# Patient Record
Sex: Male | Born: 1935 | Race: White | Hispanic: No | Smoking: Current every day smoker
Health system: Southern US, Community
[De-identification: ages and names within clinical notes are randomized; demographics above are authoritative.]

## PROBLEM LIST (undated history)

## (undated) DIAGNOSIS — I2510 Atherosclerotic heart disease of native coronary artery without angina pectoris: Secondary | ICD-10-CM

## (undated) DIAGNOSIS — I459 Conduction disorder, unspecified: Secondary | ICD-10-CM

## (undated) DIAGNOSIS — Z9841 Cataract extraction status, right eye: Secondary | ICD-10-CM

## (undated) DIAGNOSIS — H538 Other visual disturbances: Secondary | ICD-10-CM

## (undated) DIAGNOSIS — E785 Hyperlipidemia, unspecified: Secondary | ICD-10-CM

## (undated) DIAGNOSIS — I639 Cerebral infarction, unspecified: Secondary | ICD-10-CM

## (undated) DIAGNOSIS — N400 Enlarged prostate without lower urinary tract symptoms: Secondary | ICD-10-CM

## (undated) DIAGNOSIS — G309 Alzheimer's disease, unspecified: Secondary | ICD-10-CM

## (undated) DIAGNOSIS — I10 Essential (primary) hypertension: Secondary | ICD-10-CM

## (undated) DIAGNOSIS — H269 Unspecified cataract: Secondary | ICD-10-CM

## (undated) DIAGNOSIS — H409 Unspecified glaucoma: Secondary | ICD-10-CM

## (undated) DIAGNOSIS — F0280 Dementia in other diseases classified elsewhere without behavioral disturbance: Secondary | ICD-10-CM

## (undated) HISTORY — DX: Cataract extraction status, right eye: Z98.41

## (undated) HISTORY — DX: Unspecified glaucoma: H40.9

## (undated) HISTORY — DX: Other visual disturbances: H53.8

## (undated) HISTORY — DX: Conduction disorder, unspecified: I45.9

## (undated) HISTORY — DX: Benign prostatic hyperplasia without lower urinary tract symptoms: N40.0

## (undated) HISTORY — DX: Hyperlipidemia, unspecified: E78.5

## (undated) HISTORY — DX: Atherosclerotic heart disease of native coronary artery without angina pectoris: I25.10

## (undated) HISTORY — DX: Essential (primary) hypertension: I10

## (undated) HISTORY — DX: Alzheimer's disease, unspecified: G30.9

## (undated) HISTORY — DX: Dementia in other diseases classified elsewhere, unspecified severity, without behavioral disturbance, psychotic disturbance, mood disturbance, and anxiety: F02.80

## (undated) HISTORY — DX: Unspecified cataract: H26.9

## (undated) HISTORY — DX: Cerebral infarction, unspecified: I63.9

---

## 2004-04-13 ENCOUNTER — Ambulatory Visit: Payer: Self-pay | Admitting: Cardiology

## 2004-04-13 ENCOUNTER — Inpatient Hospital Stay (HOSPITAL_COMMUNITY): Admission: EM | Admit: 2004-04-13 | Discharge: 2004-04-14 | Payer: Self-pay | Admitting: Cardiology

## 2004-04-13 HISTORY — PX: INSERT / REPLACE / REMOVE PACEMAKER: SUR710

## 2004-04-26 ENCOUNTER — Ambulatory Visit: Payer: Self-pay

## 2004-04-26 ENCOUNTER — Ambulatory Visit: Payer: Self-pay | Admitting: Internal Medicine

## 2004-08-07 ENCOUNTER — Ambulatory Visit: Payer: Self-pay | Admitting: Cardiology

## 2004-08-07 ENCOUNTER — Ambulatory Visit: Payer: Self-pay | Admitting: Internal Medicine

## 2005-05-24 ENCOUNTER — Ambulatory Visit: Payer: Self-pay | Admitting: Internal Medicine

## 2005-05-29 ENCOUNTER — Ambulatory Visit: Payer: Self-pay | Admitting: Cardiology

## 2005-07-25 ENCOUNTER — Ambulatory Visit: Payer: Self-pay | Admitting: Cardiology

## 2005-07-27 ENCOUNTER — Inpatient Hospital Stay (HOSPITAL_COMMUNITY): Admission: AD | Admit: 2005-07-27 | Discharge: 2005-08-06 | Payer: Self-pay | Admitting: Vascular Surgery

## 2005-07-27 ENCOUNTER — Ambulatory Visit: Payer: Self-pay | Admitting: Cardiology

## 2005-07-28 ENCOUNTER — Encounter (INDEPENDENT_AMBULATORY_CARE_PROVIDER_SITE_OTHER): Payer: Self-pay | Admitting: *Deleted

## 2005-07-31 ENCOUNTER — Ambulatory Visit: Payer: Self-pay | Admitting: Pulmonary Disease

## 2005-08-01 ENCOUNTER — Ambulatory Visit: Payer: Self-pay | Admitting: Physical Medicine & Rehabilitation

## 2006-10-10 ENCOUNTER — Encounter: Admission: RE | Admit: 2006-10-10 | Discharge: 2006-10-10 | Payer: Self-pay | Admitting: Vascular Surgery

## 2006-10-10 ENCOUNTER — Ambulatory Visit: Payer: Self-pay | Admitting: Vascular Surgery

## 2007-03-05 ENCOUNTER — Ambulatory Visit: Payer: Self-pay | Admitting: Cardiology

## 2007-06-18 ENCOUNTER — Ambulatory Visit (HOSPITAL_COMMUNITY): Admission: RE | Admit: 2007-06-18 | Discharge: 2007-06-18 | Payer: Self-pay | Admitting: Ophthalmology

## 2007-07-10 ENCOUNTER — Ambulatory Visit: Payer: Self-pay

## 2007-09-03 ENCOUNTER — Ambulatory Visit (HOSPITAL_COMMUNITY): Admission: RE | Admit: 2007-09-03 | Discharge: 2007-09-03 | Payer: Self-pay | Admitting: Ophthalmology

## 2007-09-13 ENCOUNTER — Emergency Department (HOSPITAL_COMMUNITY): Admission: EM | Admit: 2007-09-13 | Discharge: 2007-09-13 | Payer: Self-pay | Admitting: Emergency Medicine

## 2008-09-29 ENCOUNTER — Encounter (INDEPENDENT_AMBULATORY_CARE_PROVIDER_SITE_OTHER): Payer: Self-pay | Admitting: *Deleted

## 2008-11-11 ENCOUNTER — Ambulatory Visit: Payer: Self-pay | Admitting: Internal Medicine

## 2010-03-01 ENCOUNTER — Ambulatory Visit (HOSPITAL_COMMUNITY): Admission: RE | Admit: 2010-03-01 | Discharge: 2010-03-01 | Payer: Self-pay | Admitting: Ophthalmology

## 2010-03-09 ENCOUNTER — Encounter (INDEPENDENT_AMBULATORY_CARE_PROVIDER_SITE_OTHER): Payer: Self-pay | Admitting: *Deleted

## 2010-07-10 NOTE — Letter (Signed)
Summary: Device-Delinquent Check  [facility], Main Office  [address]   [place], [place] [address]   Phone: [phone]  Fax: [phone]     March 09, 2010 MRN: [id number]   Bruce French [address] [place], [place]  [address]   Dear Bruce French,  According to our records, you have not had your implanted device checked in the recommended period of time.  We are unable to determine appropriate device function without checking your device on a regular basis.  Please call our office to schedule an appointment with Dr Graciela Husbands,  as soon as possible.  If you are having your device checked by another physician, please call us so that we may update our records.  Thank you, Letta Moynahan, EMT  March 09, 2010 9:57 AM   [facility]

## 2010-08-23 LAB — BASIC METABOLIC PANEL
BUN: 23 mg/dL (ref 6–23)
CO2: 31 mEq/L (ref 19–32)
Calcium: 9.4 mg/dL (ref 8.4–10.5)
Chloride: 99 mEq/L (ref 96–112)
Creatinine, Ser: 1.37 mg/dL (ref 0.4–1.5)
GFR calc Af Amer: >60 mL/min (ref >60)
GFR calc non Af Amer: 50 mL/min — ABNORMAL LOW (ref >60)
Glucose, Bld: 121 mg/dL — ABNORMAL HIGH (ref 70–99)
Potassium: 3.7 mEq/L (ref 3.5–5.1)
Sodium: 137 mEq/L (ref 135–145)

## 2010-08-23 LAB — HEMOGLOBIN AND HEMATOCRIT, BLOOD
HCT: 44.3 % (ref 39.0–52.0)
Hemoglobin: 14.6 g/dL (ref 13.0–17.0)

## 2010-10-23 NOTE — Op Note (Signed)
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   OPERATION PERFORMED:  Anterior chamber washout and lens aspiration and  posterior synechialysis, left eye.   SURGEON:  Bonne Dolores. Alto Denver, M.D.   ANESTHESIA:  Topical with monitored anesthesia care.   OPERATIVE SUMMARY:  In the preoperative area, 2% viscous lidocaine was  placed into the left eye.  The patient was then brought to the operating  room where the eye was prepped and draped.  After placement of a  Lieberman lid speculum, a super-sharp blade was used to make a  paracentesis port at the surgeon's 2 o'clock position.  The anterior  chamber was then filled with 1% nonpreserved lidocaine solution followed  by Amvisc Plus.  The cannula with Amvisc Plus was used to lyse the  posterior synechia which were between the iris and the intraocular lens.  A 2.4 mm keratome blade was then used to make a clear corneal incision  at the temporal limbus.  The irrigation and aspiration handpiece was  then used to aspirate remnants of lens material on the anterior chamber  with careful attention to the chamber angles after complete washout.  The irrigation and aspiration handpiece was then removed.  Stromal  hydration of the paracentesis port and the main incision was performed.  The wound was tested for leak which was negative.  The patient tolerated  the procedure well.  There was no operative complication and he was  returned to  the recovery area in satisfactory condition.  There were no  specimens obtained and there were no prosthetic devices used.           ______________________________  Susanne Greenhouse, MD     KEH/MEDQ  D:  09/03/2007  T:  09/04/2007  Job:  [id number]

## 2010-10-23 NOTE — Cardiovascular Report (Signed)
[facility]                   [facility]ICE [facility]IC NOTE   OBBIE, LEWALLEN                      MRN:          [id number]  DATE:03/05/2007                            DOB:          28-Feb-1926    Mr. Pezzullo was seen today in the [facility] for followup of his  Medtronic Kappa pacemaker, model #901.  His device was implanted on  April 13, 2004, for a complete heart block.   Interrogation of his device demonstrates,  P waves of 4 to 5.6 millivolts with an atrial impedance of 526 ohms and  a threshold of 1.04 milliseconds.  His R waves measured 15.68 to 22.4 millivolts with an RV impedance of  610 ohms and a threshold of 0.5 volts at 0.4 milliseconds.  His battery voltage was 2.77 volts.  He is not pacemaker dependent.  He was in complete heart block today with an escape in the 40s.  He did have 11,683 mode switch episodes which represented 0.6% of his  total time.  These look like far felt R wave over sensing.  I could not  reproduce this today on interrogation, so I did not change any  sensitivities.  He did have 7 ventricular heart rate episodes the longest of which was 3  seconds.  He is programmed DDDR with a low rate of 60 and upper rate of 120 with a  paced AV of 150 milliseconds and a sensed AV of 120 milliseconds.  Stretching this out, I could not display intrinsic conduction today  because of his complete heart block, so this was not changed.  His atrial output was decreased to 2 volts.  He is currently A pacing 43.5% of the time and V pacing 99.9% of the  time.   Today, Mr. Arredondo was enrolled in [facility] and will return to the clinic  in 1 year.      Gypsy Balsam, RN,BSN  Electronically Signed      Duke Salvia, MD, [facility]  Electronically Signed   AS/MedQ  DD: 03/05/2007  DT: 03/05/2007  Job #: [id number]

## 2010-10-26 NOTE — Discharge Summary (Signed)
NAME:  Bruce French, Bruce French NO.:  [id number]   MEDICAL RECORD NO.:  [id number]          PATIENT TYPE:  INP   LOCATION:  2004                         FACILITY:  [facility]   PHYSICIAN:  Janetta Hora. Fields, MD  DATE OF BIRTH:  [birth year]   DATE OF ADMISSION:  07/27/2005  DATE OF DISCHARGE:                                 DISCHARGE SUMMARY   PRIMARY DIAGNOSIS:  Infrarenal abdominal aortic aneurysm.   SECONDARY DIAGNOSIS:  1.  Benign prostatic hypertrophy.  2.  Chronic obstructive pulmonary disease.  3.  Hyperlipidemia.  4.  Heart block status post pacemaker placement.  5.  Chronic cough.  6.  History of glaucoma.  7.  Hypertension.   ALLERGIES:  No known drug allergies.   IN-HOSPITAL OPERATION AND PROCEDURES:  Repair and graft of infrarenal  abdominal aortic aneurysm with Dacron aorto-bi-iliac graft, emergent.   HOSPITAL COURSE:  The patient is a 77 year old male transferred from  [facility] with a 6 cm abdominal aortic aneurysm.  The patient was  admitted to [facility] on February 14 with back pain and weakness in the left  leg.  A workup showed a 6 cm aneurysm with no evidence of rupture.  The  patient was transferred over to [facility] for evaluation and  possible surgery.  CT angiogram was done July 28, 2005, which revealed a  left psoas hematoma.  CT scan from [place] was unable to be obtained.  Dr.  Darrick Penna discussed with Dr. Drue Second the results of the CT scan which showed no  psoas hematomas on July 26, 2005.  It is felt that the patient's scan is  positive for ruptured aneurysm.  Following review of this, Dr. Darrick Penna  discussed with the patient taking him emergently to the operating room for  repair.  He discussed the risks and benefits of this procedure.  The patient  acknowledged understanding and agreed to proceed.   The patient was taken emergently to the operating room on July 28, 2005.  The patient underwent repair and grafting  of infrarenal abdominal aortic  aneurysm with a 14 by 8 Dacron aorta-bi-iliac bypass grafted.  It was noted  that during surgery, no rupture was seen.  The patient tolerated the  procedure well and was transferred up to the intensive care unit.  The  patient was extubated the morning of surgery.  Following surgery,  pulmonary/critical care was consulted to help manage the patient's  respiratory issues and started the patient's bronchodilators.  They  continued to follow the patient during his hospital stay.  They started him  on nebulizer.  Recommended the patient be discharged home with this.  Patient stable to be weaned off oxygen saturating greater than 90% prior to  discharge home.  The patient's postoperative course was pretty much  unremarkable.  Following extubation, he was seen to be alert and oriented x  3, neurologically intact.  The patient was out of bed and ambulating postop  day two.  The incisions were clean, dry, and intact.  He was transferred out  to 2000 postop day two.  The patient remained  n.p.o. and started on clear  liquids postoperative day three.  The patient tolerated the procedure well  with no nausea and vomiting and advanced to a regular diet on postop day  four.  Again, the patient tolerated this well with no nausea and vomiting.  The patient was positive for bowel movement on postop day four.  The patient  was seen to be hemodynamically stable following surgery.  Vital signs  remained stable.  During the patient's hospital course, Dr. Nash Shearer was  consulted to evaluate his left lower extremity weakness.  There is a concern  with the findings on CT at L4-L5.  It is felt that the patient will  eventually need EMG and nerve conduction studies if the left lower extremity  does not improve following surgery.  Dr. Nash Shearer did continue to follow the  patient postoperatively.  He will plan to follow up the patient in the next  2-4 weeks.   The patient tentatively  ready for discharge home postop day five, August 03, 2005.  Follow up appointment has been arranged with Dr. Darrick Penna for August 23, 2005, at 9:45 a.m.  Staple removal is arranged for August 16, 2005, at 9  a.m.  The patient is to contact Dr. Fredda Hammed office to schedule a follow up  appointment in 2-4 weeks.  Bruce French received instructions on diet,  activity level, and incisional care.  He was told no driving until released  to do so, no heavy lifting over 10 pounds.  The patient is told he is  allowed to shower washing his incisions using soap and water.  He is to  contact the office if he develops any drainage or opening from any of his  incision sites.  He was told to ambulate 3-4 times per day, to progress as  tolerated, and to continue doing his breathing exercises.  Home health  physical therapy and occupational therapy and home health nurse has been  arranged prior to discharge home.  The patient is educated on diet to be low  fat, low salt.   DISCHARGE MEDICATIONS:  1.  Percocet 5/325, 1-2 tabs q.4-6h. p.r.n. pain.  2.  Lipitor 10 mg daily.  3.  Paxil 10 mg daily.  4.  Flomax 0.4 mg daily.  5.  Aspirin 325 mg daily.  6.  Proscar 5 mg daily.  7.  Protonix 40 mg daily.  8.  Diovan 80 mg daily.  9.  Xanax 0.25 mg t.i.d. p.r.n.  10. Plavix 75 mg daily.  11. Spiriva 18 mcg daily.      Theda Belfast, PA    ______________________________  Janetta Hora Fields, MD    KMD/MEDQ  D:  08/02/2005  T:  08/02/2005  Job:  [id number]   cc:   Bevelyn Buckles. Nash Shearer, M.D.  Fax: [phone]

## 2010-10-26 NOTE — Discharge Summary (Signed)
NAME:  Bruce French, DELUCIA NO.:  [id number]   MEDICAL RECORD NO.:  [id number]          PATIENT TYPE:  INP   LOCATION:  3707                         FACILITY:  [facility]   PHYSICIAN:  Rollene Rotunda, M.D.   DATE OF BIRTH:  01-15-1926   DATE OF ADMISSION:  04/13/2004  DATE OF DISCHARGE:  04/14/2004                           DISCHARGE SUMMARY - REFERRING   PROCEDURE:  Medtronic pacemaker implantation April 14, 2004.   REASON FOR ADMISSION:  Please refer to Dr. Fayrene Fearing Hochrein's consultation  note of April 13, 2004 for full details.   LABORATORY DATA:  None.   HOSPITAL COURSE:  Following transfer from [facility], where  the patient presented with symptomatic complete heart block.  The patient  underwent same-day successful implantation of a Medtronic Dual-Chamber  Pacemaker, by Dr. Rollene Rotunda.  There were no noted complications.   The patient was kept overnight for overnight observation, pacemaker was  interrogated the following morning with normal function and parameters, and  patient was cleared for discharge by Dr. Sherryl Manges.  Dr. Graciela Husbands did,  however, recommend proceeding with outpatient adenosine Cardiolite stress  testing at time of Pacemaker Clinic followup in two weeks [facility]).   The patient was discharged on previous home medication regimen.   DISCHARGE MEDICATIONS:  1.  Lipitor 10 mg daily.  2.  Coated aspirin 325 mg daily.  3.  Flomax 0.4 mg daily.  4.  Diovan/HCT 160/4.5 mg daily.  5.  Protonix 40 mg daily.  6.  Plavix 75 mg daily.  7.  Paxil 10 mg daily.   INSTRUCTIONS:  The patient is to raise is affected arm to the shoulder, as  outlined per instruction sheet.   The patient will have a followup adenosine Cardiolite in approximately two  weeks, on the same day as a scheduled Pacemaker Clinic followup, in  [place].  Arrangements will be made through our office.   DISCHARGE DIAGNOSES:  1.  Symptomatic complete heart  block.      1.  Status post Medtronic Dual-Chamber Pacemaker implantation April 13, 2004.  2.  Multiple cardiac risk factors.      1.  Dyslipidemia.      2.  Hypertension.      3.  Tobacco.      4.  Age.  3.  Status post stroke 1990.  4.  Cerebrovascular disease.       GS/MEDQ  D:  04/14/2004  T:  04/14/2004  Job:  [id number]   cc:   [facility]  [address]  [address]  [place]  [place] [address]   Marcelino Duster  [address] [place]  [place] [address]  Fax: [phone]

## 2010-10-26 NOTE — Op Note (Signed)
NAME:  OSRIC, KLOPF NO.:  [id number]   MEDICAL RECORD NO.:  [id number]          PATIENT TYPE:  INP   LOCATION:  2854                         FACILITY:  [facility]   PHYSICIAN:  Rollene Rotunda, M.D.   DATE OF BIRTH:  1925-10-21   DATE OF PROCEDURE:  04/13/2004  DATE OF DISCHARGE:                                 OPERATIVE REPORT   PROCEDURE PERFORMED:  Implantation of dual chamber pacemaker.   INDICATIONS FOR PROCEDURE:  Complete heart block with severe bradycardia and  near syncope.  After informed consent was obtained, the patient was taken to  cardiac catheterization lab, prepped for pacemaker placement.  After usual  preparation and draping, intravenous midazolam was given for mild sedation.  30 mL of lidocaine was infiltrated in the left infraclavicular region.  A 5  cm incision was created over this region and electrocautery with blunt  dissection used to dissect down to the fascial plane.  Hemostasis was  achieved.  A pocket was formed subcutaneously and copiously irrigated.  The  left subclavian vein was then punctured x2.  A Medtronic model number 5076  52 cm atrial lead and 5076 58 cm ventricular lead were inserted over a  guidewire.  We used active fixation leads. The serial number for the atrial  lead was [id number], the ventricular lead [id number].  The subclavian vein  was entered extrathoracically without complications.  Threshold in the  atrium was 0.5, pulse width was 0.5, impedance 744, P-wave 8.9, no  diaphragmatic stimulation at 10 V.  The V readings were a threshold of 0.5  V, pulse width 0.5 V, impedance 822 ohms.  R-wave 24 __________ mV, current  point 7 mA.  There again was no diaphragmatic stimulation at 10 V.  With the  atrial leads and ventricular leads secured in the pectoralis fascia with a  sewing sleeve and silk suture, electrocautery was utilized to achieve  hemostasis and again, the pocket was irrigated with kanamycin solution.  A  Medtronic dual chamber pacemaker Kappa [id number], serial number [id number] was  secured to the prepectoral fascia.  The pocket was then closed using 2-0  Vicryl in a vertical mattress followed by a horizontal mattress followed by  a layer of 4-0 Vicryl.  Benzoin was painted on the skin.  Steri-Strips were  applied.  The patient tolerated the entire procedure without any  complications.       JH/MEDQ  D:  04/13/2004  T:  04/15/2004  Job:  [id number]   cc:   Marcelino Duster  [address] [place]  [place] [address]  Fax: [phone]

## 2010-10-26 NOTE — Consult Note (Signed)
NAME:  Bruce French, Bruce French NO.:  [id number]   MEDICAL RECORD NO.:  [id number]          PATIENT TYPE:  INP   LOCATION:  5703                         FACILITY:  [facility]   PHYSICIAN:  Bevelyn Buckles. Champey, M.D.DATE OF BIRTH:  28-Dec-1925   DATE OF CONSULTATION:  07/28/2005  DATE OF DISCHARGE:                                   CONSULTATION   REASON FOR CONSULTATION:  Left lower extremity weakness.   HISTORY OF PRESENT ILLNESS:  Mr. Bruce French is a 77 year old Caucasian male with  multiple medical problems who recently was transferred from [facility] after evaluation for back pain and was found to have an abdominal  aortic aneurysm on CT scan.  Patient has had complaint of low back pain,  some lower extremity weakness and pain for the past week.  He states that  his pain is intermittent and mostly around the knee and left hip.  It is  described as throbbing, nontender in intensity and Percocet usually relieves  it.  Patient has felt weak in the  lower extremity and felt unsteady on his  feet at times.  He denies any numbness or tingling, upper extremity  symptoms, neck pain, dizziness, vertigo, speech changes, vision changes,  swallowing problems, tooth problems or loss of consciousness.   PAST MEDICAL HISTORY:  1.  Positive for heart block, status post pacemaker.  2.  Elevated lipids.  3.  Hypertension.  4.  COPD.  5.  Stroke.  6.  BPH.  7.  Glaucoma.   CURRENT MEDICATIONS:  1.  Plavix.  2.  Paxil.  3.  Protonix.  4.  Hydrochlorothiazide.  5.  Xanax.  6.  Solu-Medrol.  7.  Diovan.   ALLERGIES:  NO KNOWN DRUG ALLERGIES.   FAMILY HISTORY:  Noncontributory.   SOCIAL HISTORY:  Patient does smoke.   REVIEW OF SYSTEMS:  Positive as HPI and also positive for dyspnea.  Review  of systems negative as per HPI and greater than 7 other organ systems.   PHYSICAL EXAMINATION:  VITAL SIGNS:  Temperature 96.0, pulse 60,  respirations 16, blood pressure 144/76, O2  saturation 96% on two liters.  HEENT:  Normocephalic and atraumatic.  Extraocular movements intact.  NECK:  Supple.  No carotid bruits.  LUNGS:  Clear.  CARDIOVASCULAR:  Regular.  ABDOMEN:  Soft and nontender.  EXTREMITIES:  No edema.  Good pulses.  NEUROLOGIC:  Patient is awake, alert and oriented x3.  Language is fluent.  Memory and knowledge are within normal limits.  Cranial nerves II-XII  grossly intact.  Motor examination:  Patient has 5/5 strength in bilateral  upper extremities and right lower extremity.  Patient has 2 to 3/5 strength  in proximal left lower extremity primarily in hip flexion and hip abduction.  Otherwise patient has 4+ to 5-/5 strength in left lower extremity.  Patient  has normal tone noted.  No drift is found in the upper extremities.  Sensory  examination is within normal limits to light tough.  Reflexes are 2 to 3+  throughout and fairly symmetric.  Toes are withdrawn bilaterally.  Cerebellar function  is within normal limits finger-to-nose.  Gait is  slightly wide based yet steady.   Patient did have a CT of the lumbar spine at [facility] and this, by  report only, films are not reviewed.  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  Patient also had degenerative disc disease and  degenerative spondylosis.  Patient had grade I posterior  pseudospondylolisthesis of L4 on L5 with no spinal stenosis.   LABORATORY DATA:  WBC 13.9, hemoglobin 11.8, hematocrit 35, platelets 296.  Sodium 136, potassium 4, chloride 99, CO2 29, BUN 25, creatinine 1.2,  glucose 101.  AST 59, ALT 22, total protein 5.2, albumin 2.6, calcium 8,  alkaline phosphatase 66, total bilirubin 1.2.   IMPRESSION:  Mr. Bruce French is a 77 year old Caucasian male with abdominal  aortic aneurysm, back pain and left lower extremity weakness.  CT report was  reviewed but no films are available.  I would like to view these images, so  I will repeat a CT of the lumbar spine for  further evaluation.  Continue the  patient's current pain medications.  His aneurysm might be contributing to  his pain and symptoms, however, there is concern with findings on the CT at  L4-L5, although his strength distally in the lower extremities seems fairly  good.  Patient seems to have weakness primarily in hip flexion and hip  abduction.  I think patient will eventually need EMG and nerve conduction  studies if patient does not improve after surgery.  This can be done as an  outpatient.  We will consider again neurosurgery evaluation after CT of the  lumbar spine is done and evaluated.  Patient's gait is fairly steady,  however, I would still consult PT and OT for evaluation, especially after  surgery.  We will follow the patient while he is in the hospital.      Bevelyn Buckles. Nash Shearer, M.D.  Electronically Signed     DRC/MEDQ  D:  07/28/2005  T:  07/29/2005  Job:  [id number]

## 2010-10-26 NOTE — Op Note (Signed)
NAME:  Bruce French, VILORIA NO.:  [id number]   MEDICAL RECORD NO.:  [id number]          PATIENT TYPE:  INP   LOCATION:  2004                         FACILITY:  [facility]   PHYSICIAN:  Janetta Hora. Fields, MD  DATE OF BIRTH:  1925-07-10   DATE OF PROCEDURE:  07/28/2005  DATE OF DISCHARGE:                                 OPERATIVE REPORT   PROCEDURE:  Aorto bi-iliac grafting, for repair of abdominal aortic  aneurysm.   PREOPERATIVE DIAGNOSIS:  Symptomatic infrarenal abdominal aortic aneurysm.   POSTOPERATIVE DIAGNOSIS:  1.  Symptomatic infrarenal abdominal aortic aneurysm.  2.  Appropriate reduction to symptomatic possible rupture.   ANESTHESIA:  General.   ASSISTANT:  Rowe Clack, P.A.-C.   INDICATIONS:  The patient is a 77 year old male transferred from [facility] with a complaint of back and left leg pain. He had a CT  scan at [facility], which showed a 5.9 cm infrarenal abdominal aortic  aneurysm. He was transferred for further management of this. He had a repeat  CT scan this afternoon, which showed a left psoas hematoma.  He continues to  have some back pain and was taken to the operating room emergently for  possible ruptured aneurysm. Of note, the psoas hematoma was not present on  the CT scan at [facility] 48 hours ago, according to the radiologist  at [facility].   OPERATIVE FINDINGS:  1.  Non-ruptured 6 cm infrarenal abdominal aortic aneurysm.  2.  14 x 7 Dacron graft.   OPERATIVE DETAILS:  After obtaining informed consent, the patient was taken  to the operating room. The patient was placed in the supine position on the  operating room table. The patient was hemodynamically stable during this  time. After induction of general anesthesia and endotracheal intubation, a  nasogastric tube and Foley catheter were placed. Next, the patient was  prepped from the nipples to the knees. A midline laparotomy incision was  made, extending from  the xiphoid down to the level of the pubis. Incision  was carried down through subcutaneous tissues. The fascia and peritoneum  were opened for the full length of the incision. An Omni retractor was then  brought up into the operative field. The colon and omentum were reflected  superiorly. The small bowel was reflected to the right, and Omni retractor  was used to hold these in position. Retroperitoneum was inspected and found  to be without any staining or hematoma. The remainder of the abdomen  limited; exploratory laparotomy showed essentially normal viscera.   Next, attention was turned the retroperitoneum. This was opened with cautery  dissection. Dissection was carried up over the level of the aneurysm, to the  level of the left renal vein. Left and right renal arteries were identified  and protected. A suitable site for clamping was dissected free just below  the renal arteries. Dissection was then carried down to the level of the  aortic bifurcation. The left and right common iliac arteries were fairly  heavily calcified, but there was a suitable section that was softer -- just  above the  iliac bifurcation. These were dissected free circumferentially and  controlled with umbilical tapes.   Next, the patient was given 5000 units of intravenous heparin. The aneurysm  was opened.  Several lumbar arteries were oversewn with 2-0 silk sutures.  There was one sacral artery bleeding from the aortic bifurcation; this was  also oversewn. After hemostasis was then obtained, the neck was dissected  free. A 14 x 7 mm Dacron graft was brought up into the operative field and  fashioned to fit the proximal portion. This was then sewn in with a running  3-0 Prolene suture. At completion of the anastomosis, the clamp was removed  and peripheral DeBakey clamp was placed on each iliac limb. The anastomosis  was tested and found be hemostatic. One additional repair stitch was placed  anteriorly.    Next, attention was turned to the iliac limbs. The the aortic bifurcation  was not suitable for placing the tube graft. Therefore, suitable segment of  the left and right common iliac arteries were prepared for grafting. There  was a soft segment anteriorly on both these arteries, although there was  fairly heavy calcification.. Of note, the inferior mesenteric artery had  good back bleeding and this was ligated. Next, the right limb of the graft  was pulled down to the right common iliac artery. This was spatulated on the  artery side and on the graft side. This was brought down to the level of the  mid common iliac artery and sewn end-to-end using a running 5-0 Prolene  suture. Just prior to completion of the anastomosis, it was fore bled, back  bled and thoroughly flushed. Anastomosis was secured and the clamp was  released from the right leg. There was a brief transient pressure drop, and  this recovered quickly.   Attention was then turned to the left side. In similar fashion, at mid iliac  artery, the artery was transected and the graft cut to length.  The graft  was then sewn end-to-end to the left common iliac artery in similar fashion,  using running 5-0 Prolene suture. Just prior to completion of the  anastomosis, it was fore bled, back bled and thoroughly flushed. Clamps were  released. Again, there was a brief pressure drop which quickly recovered.   Next, all anastomoses were inspected and found be hemostatic. The left colon  was inspected and found to be pink and well-perfused. Retroperitoneum and  the aneurysm sac were then closed with a running 3-0 Prolene suture. The  viscera returned to their normal position. The fascia was reapproximated  using a #1 PDS suture. Skin was closed with staples. The patient's feet were  inspected at the end of the case; found to be pink and warm.  The patient tolerated procedure well and there were no complications.  Instrument, sponge and  needle counts were correct at the end of the case.  The patient was taken to the intensive care unit intubated and in stable  condition.           ______________________________  Janetta Hora Fields, MD     CEF/MEDQ  D:  08/06/2005  T:  08/06/2005  Job:  [id number]

## 2011-02-28 LAB — BASIC METABOLIC PANEL
BUN: 14
CO2: 28
Calcium: 9.6
Chloride: 95 — ABNORMAL LOW
Creatinine, Ser: 1.2
GFR calc Af Amer: >60
GFR calc non Af Amer: 58 — ABNORMAL LOW
Glucose, Bld: 75
Potassium: 3.8
Sodium: 134 — ABNORMAL LOW

## 2011-02-28 LAB — HEMOGLOBIN AND HEMATOCRIT, BLOOD
HCT: 45.9
Hemoglobin: 15.4

## 2011-03-04 LAB — BASIC METABOLIC PANEL
BUN: 19
CO2: 27
Calcium: 9.1
Chloride: 103
Creatinine, Ser: 1.23
GFR calc Af Amer: >60
GFR calc non Af Amer: 57 — ABNORMAL LOW
Glucose, Bld: 99
Potassium: 3.9
Sodium: 136

## 2011-03-04 LAB — HEMOGLOBIN AND HEMATOCRIT, BLOOD
HCT: 43.8
Hemoglobin: 15.4

## 2011-12-06 ENCOUNTER — Encounter: Payer: Self-pay | Admitting: *Deleted

## 2012-03-31 DIAGNOSIS — L918 Other hypertrophic disorders of the skin: Secondary | ICD-10-CM | POA: Diagnosis not present

## 2012-03-31 DIAGNOSIS — H02839 Dermatochalasis of unspecified eye, unspecified eyelid: Secondary | ICD-10-CM | POA: Diagnosis not present

## 2012-03-31 DIAGNOSIS — H04229 Epiphora due to insufficient drainage, unspecified lacrimal gland: Secondary | ICD-10-CM | POA: Diagnosis not present

## 2012-03-31 DIAGNOSIS — H02539 Eyelid retraction unspecified eye, unspecified lid: Secondary | ICD-10-CM | POA: Diagnosis not present

## 2012-03-31 DIAGNOSIS — L908 Other atrophic disorders of skin: Secondary | ICD-10-CM | POA: Diagnosis not present

## 2012-05-21 ENCOUNTER — Other Ambulatory Visit (HOSPITAL_COMMUNITY): Payer: Self-pay | Admitting: Family Medicine

## 2012-05-21 DIAGNOSIS — R51 Headache: Secondary | ICD-10-CM

## 2012-05-22 ENCOUNTER — Ambulatory Visit (HOSPITAL_COMMUNITY): Payer: PRIVATE HEALTH INSURANCE

## 2012-05-26 ENCOUNTER — Ambulatory Visit (HOSPITAL_COMMUNITY): Payer: PRIVATE HEALTH INSURANCE

## 2012-12-01 DIAGNOSIS — N189 Chronic kidney disease, unspecified: Secondary | ICD-10-CM | POA: Diagnosis not present

## 2012-12-01 DIAGNOSIS — I10 Essential (primary) hypertension: Secondary | ICD-10-CM | POA: Diagnosis not present

## 2012-12-01 DIAGNOSIS — F0390 Unspecified dementia without behavioral disturbance: Secondary | ICD-10-CM | POA: Diagnosis not present

## 2012-12-01 DIAGNOSIS — E119 Type 2 diabetes mellitus without complications: Secondary | ICD-10-CM | POA: Diagnosis not present

## 2012-12-01 DIAGNOSIS — M1990 Unspecified osteoarthritis, unspecified site: Secondary | ICD-10-CM | POA: Diagnosis not present

## 2012-12-01 DIAGNOSIS — F329 Major depressive disorder, single episode, unspecified: Secondary | ICD-10-CM | POA: Diagnosis not present

## 2012-12-01 DIAGNOSIS — F17200 Nicotine dependence, unspecified, uncomplicated: Secondary | ICD-10-CM | POA: Diagnosis not present

## 2012-12-01 DIAGNOSIS — E782 Mixed hyperlipidemia: Secondary | ICD-10-CM | POA: Diagnosis not present

## 2012-12-15 DIAGNOSIS — I442 Atrioventricular block, complete: Secondary | ICD-10-CM

## 2013-01-04 ENCOUNTER — Encounter: Payer: Self-pay | Admitting: *Deleted

## 2013-01-04 DIAGNOSIS — J439 Emphysema, unspecified: Secondary | ICD-10-CM | POA: Insufficient documentation

## 2013-01-04 DIAGNOSIS — E785 Hyperlipidemia, unspecified: Secondary | ICD-10-CM | POA: Insufficient documentation

## 2013-01-04 DIAGNOSIS — I10 Essential (primary) hypertension: Secondary | ICD-10-CM | POA: Insufficient documentation

## 2013-01-04 DIAGNOSIS — N189 Chronic kidney disease, unspecified: Secondary | ICD-10-CM | POA: Insufficient documentation

## 2013-01-04 DIAGNOSIS — F32A Depression, unspecified: Secondary | ICD-10-CM | POA: Insufficient documentation

## 2013-01-04 DIAGNOSIS — F0390 Unspecified dementia without behavioral disturbance: Secondary | ICD-10-CM | POA: Insufficient documentation

## 2013-01-04 DIAGNOSIS — E119 Type 2 diabetes mellitus without complications: Secondary | ICD-10-CM

## 2013-01-04 DIAGNOSIS — F329 Major depressive disorder, single episode, unspecified: Secondary | ICD-10-CM

## 2013-01-04 DIAGNOSIS — L859 Epidermal thickening, unspecified: Secondary | ICD-10-CM | POA: Insufficient documentation

## 2013-01-04 DIAGNOSIS — J449 Chronic obstructive pulmonary disease, unspecified: Secondary | ICD-10-CM | POA: Insufficient documentation

## 2013-01-04 DIAGNOSIS — I639 Cerebral infarction, unspecified: Secondary | ICD-10-CM | POA: Insufficient documentation

## 2013-01-05 ENCOUNTER — Encounter: Payer: Self-pay | Admitting: Cardiovascular Disease

## 2013-01-05 ENCOUNTER — Encounter: Payer: Self-pay | Admitting: *Deleted

## 2013-01-05 ENCOUNTER — Ambulatory Visit (INDEPENDENT_AMBULATORY_CARE_PROVIDER_SITE_OTHER): Payer: PRIVATE HEALTH INSURANCE | Admitting: *Deleted

## 2013-01-05 ENCOUNTER — Ambulatory Visit (HOSPITAL_COMMUNITY)
Admission: RE | Admit: 2013-01-05 | Discharge: 2013-01-05 | Disposition: A | Discharge status: Home / Self Care | Payer: PRIVATE HEALTH INSURANCE | Source: Ambulatory Visit | Attending: Cardiovascular Disease | Admitting: Cardiovascular Disease

## 2013-01-05 ENCOUNTER — Ambulatory Visit (INDEPENDENT_AMBULATORY_CARE_PROVIDER_SITE_OTHER): Payer: PRIVATE HEALTH INSURANCE | Admitting: Cardiovascular Disease

## 2013-01-05 VITALS — BP 140/70 | HR 65 | Ht 70.0 in | Wt 162.0 lb

## 2013-01-05 DIAGNOSIS — I6522 Occlusion and stenosis of left carotid artery: Secondary | ICD-10-CM

## 2013-01-05 DIAGNOSIS — I442 Atrioventricular block, complete: Secondary | ICD-10-CM

## 2013-01-05 DIAGNOSIS — R0602 Shortness of breath: Secondary | ICD-10-CM | POA: Insufficient documentation

## 2013-01-05 DIAGNOSIS — Z01818 Encounter for other preprocedural examination: Secondary | ICD-10-CM

## 2013-01-05 DIAGNOSIS — J4489 Other specified chronic obstructive pulmonary disease: Secondary | ICD-10-CM | POA: Insufficient documentation

## 2013-01-05 DIAGNOSIS — R42 Dizziness and giddiness: Secondary | ICD-10-CM | POA: Insufficient documentation

## 2013-01-05 DIAGNOSIS — Z136 Encounter for screening for cardiovascular disorders: Secondary | ICD-10-CM

## 2013-01-05 DIAGNOSIS — Z45018 Encounter for adjustment and management of other part of cardiac pacemaker: Secondary | ICD-10-CM

## 2013-01-05 DIAGNOSIS — Z45010 Encounter for checking and testing of cardiac pacemaker pulse generator [battery]: Secondary | ICD-10-CM

## 2013-01-05 DIAGNOSIS — I6529 Occlusion and stenosis of unspecified carotid artery: Secondary | ICD-10-CM

## 2013-01-05 DIAGNOSIS — J984 Other disorders of lung: Secondary | ICD-10-CM | POA: Insufficient documentation

## 2013-01-05 DIAGNOSIS — Z950 Presence of cardiac pacemaker: Secondary | ICD-10-CM | POA: Insufficient documentation

## 2013-01-05 DIAGNOSIS — J449 Chronic obstructive pulmonary disease, unspecified: Secondary | ICD-10-CM | POA: Insufficient documentation

## 2013-01-05 LAB — PACEMAKER DEVICE OBSERVATION
BATTERY VOLTAGE: 2.61 V
BMOD-0003RV: 30
BMOD-0005RV: 95 {beats}/min
BRDY-0002RV: 65 {beats}/min
DEVICE MODEL PM: 412680
RV LEAD IMPEDENCE PM: 515 Ohm
VENTRICULAR PACING PM: 98

## 2013-01-05 LAB — BASIC METABOLIC PANEL
BUN: 20 mg/dL (ref 6–23)
CO2: 29 mEq/L (ref 19–32)
Calcium: 9.8 mg/dL (ref 8.4–10.5)
Chloride: 101 mEq/L (ref 96–112)
Creat: 1.37 mg/dL — ABNORMAL HIGH (ref 0.50–1.35)
Glucose, Bld: 98 mg/dL (ref 70–99)
Potassium: 3.9 mEq/L (ref 3.5–5.3)
Sodium: 139 mEq/L (ref 135–145)

## 2013-01-05 LAB — CBC
HCT: 46.4 % (ref 39.0–52.0)
Hemoglobin: 15.4 g/dL (ref 13.0–17.0)
MCH: 29.8 pg (ref 26.0–34.0)
MCHC: 33.2 g/dL (ref 30.0–36.0)
MCV: 89.9 fL (ref 78.0–100.0)
Platelets: 239 10*3/uL (ref 150–400)
RDW: 13.6 % (ref 11.5–15.5)
WBC: 9.8 10*3/uL (ref 4.0–10.5)

## 2013-01-05 LAB — PROTIME-INR
INR: 1.02 (ref <1.50)
Prothrombin Time: 13.2 seconds (ref 11.6–15.2)

## 2013-01-05 LAB — APTT: aPTT: 30 seconds (ref 24–37)

## 2013-01-05 NOTE — Progress Notes (Signed)
Patient ID: JULIAS MOULD, male   DOB: 30-Jul-1925, 79 y.o.   MRN: [id number]    CARDIOLOGY CONSULT NOTE  Patient ID: MARTHA SOLTYS MRN: [id number] DOB/AGE: May 04, 1926 79 y.o.  Admit date: (Not on file) Primary Physician: Juliette Alcide, MD  Reason for Consultation: dizziness, arrhythmia  HPI:  Mr. Pavlov is an 79 y.o. Male with a PMH significant for CVA, HTN, hyperlipidemia, GERD, and type II diabetes mellitus, who presents with dizziness. He also has a pacemaker which was implanted on April 13, 2004, for complete heart block. His daughter says his pacemaker hasn't been interrogated in a long time.  He's been having bad headaches and dizziness for the past 3-4 months.  He denies chest pain, shortness of breath, and palpitations.  He says when he stands up, he feels "swimmy-headed" and it takes him a little while to gain his balance.  He reportedly had a head CT and carotid Dopplers "not too long ago at [facility]".   Review of systems complete and found to be negative unless listed above in HPI  Past Medical History: see HPI   No family history on file.  History   Social History  . Marital Status: Widowed    Spouse Name: N/A    Number of Children: N/A  . Years of Education: N/A   Occupational History  . Not on file.   Social History Main Topics  . Smoking status: Current Every Day Smoker -- 1.00 packs/day for 80 years    Types: Cigarettes  . Smokeless tobacco: Not on file  . Alcohol Use: Not on file  . Drug Use: Not on file  . Sexually Active: Not on file   Other Topics Concern  . Not on file   Social History Narrative  . No narrative on file      Physical exam Blood pressure 140/70, pulse 65, height 5\' 10"  (1.778 m), weight 162 lb (73.483 kg). General: NAD Neck: No JVD, no thyromegaly or thyroid nodule.  Lungs: Clear to auscultation bilaterally with normal respiratory effort. CV: Nondisplaced PMI.  Heart regular S1/S2, no S3/S4, no  murmur.  No peripheral edema.  No carotid bruit.  Normal pedal pulses.  Abdomen: Soft, nontender, no hepatosplenomegaly, no distention.  Skin: Intact without lesions or rashes.  Neurologic: Alert and oriented x 3.  Psych: Normal affect. Extremities: No clubbing or cyanosis.  HEENT: Normal.   Labs:   Lab Results  Component Value Date   HGB 14.6 02/22/2010   HCT 44.3 02/22/2010   No results found for this basename: NA, K, CL, CO2, BUN, CREATININE, CALCIUM, LABALBU, PROT, BILITOT, ALKPHOS, ALT, AST, GLUCOSE,  in the last 168 hours No results found for this basename: CKTOTAL, CKMB, CKMBINDEX, TROPONINI    No results found for this basename: CHOL   No results found for this basename: HDL   No results found for this basename: LDLCALC   No results found for this basename: TRIG   No results found for this basename: CHOLHDL   No results found for this basename: LDLDIRECT       EKG: V-paced, 65 bpm  Pacemaker interrogation: VVI pacing, ERI December 07, 2012  Echo (12/15/2012): Normal LV systolic function, EF 55-60%, grade I diastolic dysfunction, mild LAE, MAC, mild AI, mild TR  Carotid Dopplers (12/15/2012): 50-69% LICA, less than 50% RICA  Head CT: atrophic changes, no acute infarct  ASSESSMENT AND PLAN:  1. Dizziness: It appears the pacemaker's ERI was December 07, 2012, and he  is now pacing only in VVI mode for battery preservation. This may very likely be contributing to the patient's symptoms. We will arrange for him to have the battery replaced. In the meantime, I will obtain the results of his head CT and carotid Dopplers, albeit carotid stenosis would not be contributing to dizziness. He does have a h/o CVA and he may have posterior circulatory problems causing his dizziness as well.  2. Carotid stenosis: will survey with annual Dopplers.   Signed: Prentice Docker, M.D., F.A.C.C. 01/05/2013, 2:04 PM

## 2013-01-05 NOTE — Patient Instructions (Addendum)
Your physician recommends that you schedule a follow-up appointment in: POST PROCEDURE WITH GT/3 MONTHS WITH Dr. Purvis Sheffield    Your physician has recommended that you have a pacemaker inserted. A pacemaker is a small device that is placed under the skin of your chest or abdomen to help control abnormal heart rhythms. This device uses electrical pulses to prompt the heart to beat at a normal rate. Pacemakers are used to treat heart rhythms that are too slow. Wire (leads) are attached to the pacemaker that goes into the chambers of you heart. This is done in the hospital and usually requires and overnight stay. Please see the instruction sheet given to you today for more information.  Your physician recommends that you return for lab work in: TODAY (SLIPS GIVEN FOR PT,PT/INR,BMET,CBC)  A chest x-ray takes a picture of the organs and structures inside the chest, including the heart, lungs, and blood vessels. This test can show several things, including, whether the heart is enlarges; whether fluid is building up in the lungs; and whether pacemaker / defibrillator leads are still in place.TODAY

## 2013-01-05 NOTE — Progress Notes (Signed)
ICD check in office. 

## 2013-01-06 ENCOUNTER — Encounter (HOSPITAL_COMMUNITY): Payer: Self-pay | Admitting: Pharmacy Technician

## 2013-01-06 ENCOUNTER — Encounter: Payer: Self-pay | Admitting: Cardiovascular Disease

## 2013-01-06 MED ORDER — SODIUM CHLORIDE 0.9 % IR SOLN
80.0000 mg | Status: AC
  Filled 2013-01-06: qty 2

## 2013-01-06 MED ORDER — CEFAZOLIN SODIUM-DEXTROSE 2-3 GM-% IV SOLR
2.0000 g | INTRAVENOUS | Status: AC
  Filled 2013-01-06: qty 50

## 2013-01-07 ENCOUNTER — Encounter (HOSPITAL_COMMUNITY)
Admission: RE | Disposition: A | Discharge status: Home / Self Care | Payer: Self-pay | Source: Ambulatory Visit | Attending: Internal Medicine

## 2013-01-07 ENCOUNTER — Ambulatory Visit (HOSPITAL_COMMUNITY)
Admission: RE | Admit: 2013-01-07 | Discharge: 2013-01-07 | Disposition: A | Discharge status: Home / Self Care | Payer: Medicare Other | Source: Ambulatory Visit | Attending: Internal Medicine | Admitting: Internal Medicine

## 2013-01-07 DIAGNOSIS — I519 Heart disease, unspecified: Secondary | ICD-10-CM | POA: Insufficient documentation

## 2013-01-07 DIAGNOSIS — Z45018 Encounter for adjustment and management of other part of cardiac pacemaker: Secondary | ICD-10-CM | POA: Insufficient documentation

## 2013-01-07 DIAGNOSIS — F17200 Nicotine dependence, unspecified, uncomplicated: Secondary | ICD-10-CM | POA: Insufficient documentation

## 2013-01-07 DIAGNOSIS — I079 Rheumatic tricuspid valve disease, unspecified: Secondary | ICD-10-CM | POA: Insufficient documentation

## 2013-01-07 DIAGNOSIS — I442 Atrioventricular block, complete: Secondary | ICD-10-CM

## 2013-01-07 DIAGNOSIS — I359 Nonrheumatic aortic valve disorder, unspecified: Secondary | ICD-10-CM | POA: Insufficient documentation

## 2013-01-07 DIAGNOSIS — I6529 Occlusion and stenosis of unspecified carotid artery: Secondary | ICD-10-CM | POA: Insufficient documentation

## 2013-01-07 HISTORY — PX: PERMANENT PACEMAKER GENERATOR CHANGE: SHX6022

## 2013-01-07 LAB — SURGICAL PCR SCREEN: Staphylococcus aureus: NEGATIVE

## 2013-01-07 SURGERY — PERMANENT PACEMAKER GENERATOR CHANGE
Anesthesia: LOCAL

## 2013-01-07 MED ORDER — FENTANYL CITRATE 0.05 MG/ML IJ SOLN
INTRAMUSCULAR | Status: AC
  Filled 2013-01-07: qty 2

## 2013-01-07 MED ORDER — MUPIROCIN 2 % EX OINT
TOPICAL_OINTMENT | CUTANEOUS | Status: AC
  Administered 2013-01-07: 1 via NASAL
  Filled 2013-01-07: qty 22

## 2013-01-07 MED ORDER — LIDOCAINE HCL (PF) 1 % IJ SOLN
INTRAMUSCULAR | Status: AC
  Filled 2013-01-07: qty 60

## 2013-01-07 MED ORDER — MIDAZOLAM HCL 5 MG/5ML IJ SOLN
INTRAMUSCULAR | Status: AC
  Filled 2013-01-07: qty 5

## 2013-01-07 MED ORDER — ACETAMINOPHEN 325 MG PO TABS
325.0000 mg | ORAL_TABLET | ORAL | Status: DC | PRN
  Filled 2013-01-07: qty 2

## 2013-01-07 MED ORDER — ONDANSETRON HCL 4 MG/2ML IJ SOLN: 4.0000 mg | Freq: Four times a day (QID) | INTRAMUSCULAR | Status: DC | PRN

## 2013-01-07 MED ORDER — MUPIROCIN 2 % EX OINT
TOPICAL_OINTMENT | Freq: Two times a day (BID) | CUTANEOUS | Status: DC
  Filled 2013-01-07: qty 22

## 2013-01-07 MED ORDER — SODIUM CHLORIDE 0.9 % IV SOLN
INTRAVENOUS | Status: DC
  Administered 2013-01-07: 12:00:00 via INTRAVENOUS

## 2013-01-07 MED ORDER — CHLORHEXIDINE GLUCONATE 4 % EX LIQD
60.0000 mL | Freq: Once | CUTANEOUS | Status: AC
  Administered 2013-01-07: 4 via TOPICAL
  Filled 2013-01-07: qty 60

## 2013-01-07 NOTE — Interval H&P Note (Signed)
History and Physical Interval Note:  01/07/2013 11:51 AM  Bruce French  has presented today for surgery, with the diagnosis of hb  The various methods of treatment have been discussed with the patient and family. After consideration of risks, benefits and other options for treatment, the patient has consented to  Procedure(s): PERMANENT PACEMAKER GENERATOR CHANGE (N/A) as a surgical intervention .  The patient's history has been reviewed, patient examined, no change in status, stable for surgery.  I have reviewed the patient's chart and labs.  Questions were answered to the patient's satisfaction.     [name].D.

## 2013-01-07 NOTE — CV Procedure (Signed)
DDD PPM removal and insertion of a new device without immediate complication. [id number].

## 2013-01-07 NOTE — Op Note (Signed)
NAME:  Bruce French, Bruce French NO.:  [id number]  MEDICAL RECORD NO.:  [id number]  LOCATION:  [facility]                         FACILITY:  [facility]  PHYSICIAN:  Doylene Canning. Ladona Ridgel, MD    DATE OF BIRTH:  1926-05-18  DATE OF PROCEDURE:  01/07/2013 DATE OF DISCHARGE:  01/07/2013                              OPERATIVE REPORT   PROCEDURE PERFORMED:  Removal of previously implanted dual-chamber pacemaker, which had reached elective replacement and insertion of a new dual-chamber device in a patient with underlying complete heart block.  INTRODUCTION:  The patient is an 79 year old male with longstanding complete heart block, status post permanent pacemaker insertion.  His device has reached elective replacement and he is now referred for removal of his old device and insertion of a new one.  PROCEDURE IN DETAIL:  After informed consent was obtained, the patient was taken to the diagnostic EP lab in a fasting state.  After usual preparation and draping, intravenous fentanyl and midazolam was given for sedation.  A 30 mL of lidocaine was infiltrated into the left infraclavicular region.  A 5-cm incision was carried out over this region.  Electrocautery was utilized to dissect down to the fascial plane.  Electrocautery was then utilized to enter the subcutaneous pocket.  The generator was removed with gentle traction.  The atrial lead was evaluated and found to be working satisfactorily.  The impedance was 460.  The P-waves were 7.  Threshold was less than 1 V at 0.5 milliseconds.  The RV lead was then removed from the pacemaker and evaluated.  The R-waves were unavailable secondary to complete heart block.  The threshold was 0.5 V at 0.5 milliseconds, and the pacing impedance was 470 ohms.  The pacemaker ventricular lead was then connected to the new Medtronic dual-chamber Sensia, dual-chamber pacemaker serial [id number], and the atrial lead was connected as well. The device was placed  back in the subcutaneous pocket.  The pocket was irrigated with antibiotic irrigation, and the incision was closed with 2- 0 Vicryl and 3-0 Vicryl.  Benzoin and Steri-Strips were painted on the skin, pressure dressing was applied.  The patient was returned to his room in satisfactory condition.  COMPLICATIONS:  There were no immediate procedure complications.  RESULTS:  This demonstrates successful removal of a previously implanted Medtronic dual-chamber pacemaker, which reached elective replacement and insertion of a new dual-chamber device without immediate procedure complications.     Doylene Canning. Ladona Ridgel, MD     GWT/MEDQ  D:  01/07/2013  T:  01/07/2013  Job:  [id number]

## 2013-01-07 NOTE — H&P (View-Only) (Signed)
Patient ID: Bruce French, male   DOB: 09/01/1925, 79 y.o.   MRN: [id number]    CARDIOLOGY CONSULT NOTE  Patient ID: Bruce French MRN: [id number] DOB/AGE: 04/17/1926 79 y.o.  Admit date: (Not on file) Primary Physician: Bruce E Burdine, MD  Reason for Consultation: dizziness, arrhythmia  HPI:  Bruce French is an 79 y.o. Male with a PMH significant for CVA, HTN, hyperlipidemia, GERD, and type II diabetes mellitus, who presents with dizziness. He also has a pacemaker which was implanted on April 13, 2004, for complete heart block. His daughter says his pacemaker hasn't been interrogated in a long time.  He's been having bad headaches and dizziness for the past 3-4 months.  He denies chest pain, shortness of breath, and palpitations.  He says when he stands up, he feels "swimmy-headed" and it takes him a little while to gain his balance.  He reportedly had a head CT and carotid Dopplers "not too long ago at [facility]".   Review of systems complete and found to be negative unless listed above in HPI  Past Medical History: see HPI   No family history on file.  History   Social History  . Marital Status: Widowed    Spouse Name: N/A    Number of Children: N/A  . Years of Education: N/A   Occupational History  . Not on file.   Social History Main Topics  . Smoking status: Current Every Day Smoker -- 1.00 packs/day for 80 years    Types: Cigarettes  . Smokeless tobacco: Not on file  . Alcohol Use: Not on file  . Drug Use: Not on file  . Sexually Active: Not on file   Other Topics Concern  . Not on file   Social History Narrative  . No narrative on file      Physical exam Blood pressure 140/70, pulse 65, height 5' 10" (1.778 m), weight 162 lb (73.483 kg). General: NAD Neck: No JVD, no thyromegaly or thyroid nodule.  Lungs: Clear to auscultation bilaterally with normal respiratory effort. CV: Nondisplaced PMI.  Heart regular S1/S2, no S3/S4, no  murmur.  No peripheral edema.  No carotid bruit.  Normal pedal pulses.  Abdomen: Soft, nontender, no hepatosplenomegaly, no distention.  Skin: Intact without lesions or rashes.  Neurologic: Alert and oriented x 3.  Psych: Normal affect. Extremities: No clubbing or cyanosis.  HEENT: Normal.   Labs:   Lab Results  Component Value Date   HGB 14.6 02/22/2010   HCT 44.3 02/22/2010   No results found for this basename: NA, K, CL, CO2, BUN, CREATININE, CALCIUM, LABALBU, PROT, BILITOT, ALKPHOS, ALT, AST, GLUCOSE,  in the last 168 hours No results found for this basename: CKTOTAL, CKMB, CKMBINDEX, TROPONINI    No results found for this basename: CHOL   No results found for this basename: HDL   No results found for this basename: LDLCALC   No results found for this basename: TRIG   No results found for this basename: CHOLHDL   No results found for this basename: LDLDIRECT       EKG: V-paced, 65 bpm  Pacemaker interrogation: VVI pacing, ERI December 07, 2012  Echo (12/15/2012): Normal LV systolic function, EF 55-60%, grade I diastolic dysfunction, mild LAE, MAC, mild AI, mild TR  Carotid Dopplers (12/15/2012): 50-69% LICA, less than 50% RICA  Head CT: atrophic changes, no acute infarct  ASSESSMENT AND PLAN:  1. Dizziness: It appears the pacemaker's ERI was December 07, 2012, and he   is now pacing only in VVI mode for battery preservation. This may very likely be contributing to the patient's symptoms. We will arrange for him to have the battery replaced. In the meantime, I will obtain the results of his head CT and carotid Dopplers, albeit carotid stenosis would not be contributing to dizziness. He does have a h/o CVA and he may have posterior circulatory problems causing his dizziness as well.  2. Carotid stenosis: will survey with annual Dopplers.   Signed: Folashade French, M.D., F.A.C.C. 01/05/2013, 2:04 PM  

## 2013-01-14 ENCOUNTER — Encounter: Payer: Self-pay | Admitting: Internal Medicine

## 2013-01-14 ENCOUNTER — Ambulatory Visit (INDEPENDENT_AMBULATORY_CARE_PROVIDER_SITE_OTHER): Payer: PRIVATE HEALTH INSURANCE | Admitting: *Deleted

## 2013-01-14 DIAGNOSIS — Z950 Presence of cardiac pacemaker: Secondary | ICD-10-CM

## 2013-01-14 DIAGNOSIS — I442 Atrioventricular block, complete: Secondary | ICD-10-CM

## 2013-01-14 NOTE — Progress Notes (Signed)
Pt seen in device clinic for follow up of recently replaced pacemaker.  Wound well healed.  Mild bruise, no redness, swelling, or edema.  Steri-strips removed today.   Device interrogated and found to be functioning normally.  No changes made today. See PaceArt for full details.  Pt denies chest pain, shortness of breath, palpitations, or dizziness.  Pt to follow up with Dr. Darcey Nora 04/15/13.  Savaya Hakes 01/14/2013 4:10 PM

## 2013-01-15 LAB — PACEMAKER DEVICE OBSERVATION
AL THRESHOLD: 0.5 V
ATRIAL PACING PM: 55
RV LEAD THRESHOLD: 0.5 V

## 2013-02-05 ENCOUNTER — Ambulatory Visit: Payer: PRIVATE HEALTH INSURANCE | Admitting: Cardiovascular Disease

## 2013-02-09 ENCOUNTER — Encounter: Payer: Self-pay | Admitting: Internal Medicine

## 2013-03-03 DIAGNOSIS — N189 Chronic kidney disease, unspecified: Secondary | ICD-10-CM | POA: Diagnosis not present

## 2013-03-03 DIAGNOSIS — F0390 Unspecified dementia without behavioral disturbance: Secondary | ICD-10-CM | POA: Diagnosis not present

## 2013-03-03 DIAGNOSIS — F329 Major depressive disorder, single episode, unspecified: Secondary | ICD-10-CM | POA: Diagnosis not present

## 2013-03-03 DIAGNOSIS — E119 Type 2 diabetes mellitus without complications: Secondary | ICD-10-CM | POA: Diagnosis not present

## 2013-03-03 DIAGNOSIS — E782 Mixed hyperlipidemia: Secondary | ICD-10-CM | POA: Diagnosis not present

## 2013-03-03 DIAGNOSIS — Z23 Encounter for immunization: Secondary | ICD-10-CM | POA: Diagnosis not present

## 2013-03-03 DIAGNOSIS — F17200 Nicotine dependence, unspecified, uncomplicated: Secondary | ICD-10-CM | POA: Diagnosis not present

## 2013-03-03 DIAGNOSIS — I10 Essential (primary) hypertension: Secondary | ICD-10-CM | POA: Diagnosis not present

## 2013-04-05 ENCOUNTER — Ambulatory Visit (INDEPENDENT_AMBULATORY_CARE_PROVIDER_SITE_OTHER): Payer: Medicare Other | Admitting: Cardiovascular Disease

## 2013-04-05 ENCOUNTER — Encounter: Payer: Self-pay | Admitting: Cardiovascular Disease

## 2013-04-05 VITALS — BP 164/73 | HR 74 | Ht 68.0 in | Wt 164.0 lb

## 2013-04-05 DIAGNOSIS — I6529 Occlusion and stenosis of unspecified carotid artery: Secondary | ICD-10-CM

## 2013-04-05 DIAGNOSIS — R42 Dizziness and giddiness: Secondary | ICD-10-CM

## 2013-04-05 DIAGNOSIS — I442 Atrioventricular block, complete: Secondary | ICD-10-CM | POA: Diagnosis not present

## 2013-04-05 DIAGNOSIS — I6522 Occlusion and stenosis of left carotid artery: Secondary | ICD-10-CM

## 2013-04-05 DIAGNOSIS — Z950 Presence of cardiac pacemaker: Secondary | ICD-10-CM

## 2013-04-05 DIAGNOSIS — E860 Dehydration: Secondary | ICD-10-CM

## 2013-04-05 DIAGNOSIS — I10 Essential (primary) hypertension: Secondary | ICD-10-CM

## 2013-04-05 NOTE — Progress Notes (Signed)
Patient ID: Bruce French, male   DOB: 1925/07/13, 79 y.o.   MRN: [id number]      SUBJECTIVE: I saw Bruce French for the first time on 01/05/13, when he had a chief complaint of dizziness. His pacemaker was interrogated and was found to be at end-of-life on 12/07/12. He was subsequently referred to Dr. Ladona Ridgel who replaced his device on 01/07/13.  He felt well for about a week after it was placed, but then continued to have episodes of "swimmy-headedness", which primarily occurs when going from the sitting to standing position.  He drinks 2 cups of coffee daily, and is on a diuretic for HTN.  Allergies  Allergen Reactions  . Xanax Xr [Alprazolam Er] Other (See Comments)    "he was out for 4 days."    Current Outpatient Prescriptions  Medication Sig Dispense Refill  . Ascorbic Acid (VITAMIN C) 1000 MG tablet Take 1,000 mg by mouth daily.      Marland Kitchen aspirin EC 81 MG tablet Take 81 mg by mouth daily.      Marland Kitchen atorvastatin (LIPITOR) 20 MG tablet Take 20 mg by mouth daily.      . Cholecalciferol (VITAMIN D-3) 5000 UNITS TABS Take 5,000 Units by mouth daily.      . clopidogrel (PLAVIX) 75 MG tablet Take 75 mg by mouth daily.      Marland Kitchen donepezil (ARICEPT) 10 MG tablet Take 10 mg by mouth daily.      Marland Kitchen dutasteride (AVODART) 0.5 MG capsule Take 0.5 mg by mouth daily.      Marland Kitchen esomeprazole (NEXIUM) 40 MG capsule Take 40 mg by mouth daily before breakfast.      . HYDROcodone-acetaminophen (VICODIN) 5-500 MG per tablet Take 1 tablet by mouth every 6 (six) hours as needed for pain.      . Multiple Vitamin (MULTIVITAMIN WITH MINERALS) TABS Take 1 tablet by mouth daily.      . Omega-3 Fatty Acids (FISH OIL) 1000 MG CAPS Take 2,000 mg by mouth daily.      . valsartan-hydrochlorothiazide (DIOVAN-HCT) 160-12.5 MG per tablet Take 1 tablet by mouth daily.       No current facility-administered medications for this visit.    Past Medical History  Diagnosis Date  . Blurry vision, right eye   . Alzheimer disease   .  Cataract, right eye   . Hypertension   . Hyperlipidemia   . Enlarged prostate   . Cataract extraction status of right eye   . Stroke   . Coronary artery disease   . Glaucoma   . Heart block     s/p PPM INSERT    Past Surgical History  Procedure Laterality Date  . Insert / replace / remove pacemaker  04/13/04    INSERT PPM MEDTRONIC/Model Number:  [id number]     PPM Serial Number:  [id number]    History   Social History  . Marital Status: Widowed    Spouse Name: N/A    Number of Children: N/A  . Years of Education: N/A   Occupational History  . Not on file.   Social History Main Topics  . Smoking status: Current Every Day Smoker -- 1.00 packs/day for 80 years    Types: Cigarettes  . Smokeless tobacco: Not on file  . Alcohol Use: Not on file  . Drug Use: Not on file  . Sexual Activity: Not on file   Other Topics Concern  . Not on file   Social History Narrative  .  No narrative on file     Filed Vitals:   04/05/13 1309  Height: 5\' 8"  (1.727 m)  Weight: 164 lb (74.39 kg)   BP 164/73 Pulse 74    PHYSICAL EXAM General: NAD Neck: No JVD, no thyromegaly or thyroid nodule.  Lungs: Clear to auscultation bilaterally with normal respiratory effort. CV: Nondisplaced PMI.  Heart regular S1/S2, no S3/S4, II/VI systolic murmur at base.  No peripheral edema.  No carotid bruit.  Normal pedal pulses.  Abdomen: Soft, nontender, no hepatosplenomegaly, no distention.  Neurologic: Alert and oriented x 3.  Psych: Normal affect. Extremities: No clubbing or cyanosis.   ECG: reviewed and available in electronic records.      ASSESSMENT AND PLAN: 1. Dizziness: he may have some posterior circulation issues. He also appears to be dehydrated, and as stated previously, only drinks 2 cups of coffee daily and is also on a diuretic. I have encouraged adequate fluid intake and emphasized this with his daughter. I also told him to move slowly from the sitting to standing position. 2.  Carotid stenosis: recent Dopplers in July. Will perform surveillance as needed. 3. HTN: slightly elevated today, but no change in management at present. 4. CHB s/p PPM: stable.   Prentice Docker, M.D., F.A.C.C.

## 2013-04-05 NOTE — Patient Instructions (Addendum)
Your physician recommends that you schedule a follow-up appointment in: 6 months  

## 2013-04-15 ENCOUNTER — Encounter: Payer: PRIVATE HEALTH INSURANCE | Admitting: Internal Medicine

## 2013-05-26 DIAGNOSIS — I10 Essential (primary) hypertension: Secondary | ICD-10-CM | POA: Diagnosis not present

## 2013-05-26 DIAGNOSIS — I6789 Other cerebrovascular disease: Secondary | ICD-10-CM | POA: Diagnosis not present

## 2013-05-26 DIAGNOSIS — N189 Chronic kidney disease, unspecified: Secondary | ICD-10-CM | POA: Diagnosis not present

## 2013-05-26 DIAGNOSIS — E119 Type 2 diabetes mellitus without complications: Secondary | ICD-10-CM | POA: Diagnosis not present

## 2013-05-26 DIAGNOSIS — F17200 Nicotine dependence, unspecified, uncomplicated: Secondary | ICD-10-CM | POA: Diagnosis not present

## 2013-05-26 DIAGNOSIS — M1990 Unspecified osteoarthritis, unspecified site: Secondary | ICD-10-CM | POA: Diagnosis not present

## 2013-05-26 DIAGNOSIS — E782 Mixed hyperlipidemia: Secondary | ICD-10-CM | POA: Diagnosis not present

## 2013-06-01 DIAGNOSIS — F329 Major depressive disorder, single episode, unspecified: Secondary | ICD-10-CM | POA: Diagnosis not present

## 2013-06-01 DIAGNOSIS — N189 Chronic kidney disease, unspecified: Secondary | ICD-10-CM | POA: Diagnosis not present

## 2013-06-01 DIAGNOSIS — F17200 Nicotine dependence, unspecified, uncomplicated: Secondary | ICD-10-CM | POA: Diagnosis not present

## 2013-06-01 DIAGNOSIS — I10 Essential (primary) hypertension: Secondary | ICD-10-CM | POA: Diagnosis not present

## 2013-06-01 DIAGNOSIS — E782 Mixed hyperlipidemia: Secondary | ICD-10-CM | POA: Diagnosis not present

## 2013-06-01 DIAGNOSIS — F0390 Unspecified dementia without behavioral disturbance: Secondary | ICD-10-CM | POA: Diagnosis not present

## 2013-06-01 DIAGNOSIS — M1990 Unspecified osteoarthritis, unspecified site: Secondary | ICD-10-CM | POA: Diagnosis not present

## 2013-06-01 DIAGNOSIS — E119 Type 2 diabetes mellitus without complications: Secondary | ICD-10-CM | POA: Diagnosis not present

## 2013-06-29 DIAGNOSIS — E785 Hyperlipidemia, unspecified: Secondary | ICD-10-CM | POA: Diagnosis not present

## 2013-06-29 DIAGNOSIS — N179 Acute kidney failure, unspecified: Secondary | ICD-10-CM | POA: Diagnosis not present

## 2013-06-29 DIAGNOSIS — R05 Cough: Secondary | ICD-10-CM | POA: Diagnosis not present

## 2013-06-29 DIAGNOSIS — I519 Heart disease, unspecified: Secondary | ICD-10-CM | POA: Diagnosis not present

## 2013-06-29 DIAGNOSIS — Z8673 Personal history of transient ischemic attack (TIA), and cerebral infarction without residual deficits: Secondary | ICD-10-CM | POA: Diagnosis not present

## 2013-06-29 DIAGNOSIS — B9789 Other viral agents as the cause of diseases classified elsewhere: Secondary | ICD-10-CM | POA: Diagnosis not present

## 2013-06-29 DIAGNOSIS — Z888 Allergy status to other drugs, medicaments and biological substances status: Secondary | ICD-10-CM | POA: Diagnosis not present

## 2013-06-29 DIAGNOSIS — Z7982 Long term (current) use of aspirin: Secondary | ICD-10-CM | POA: Diagnosis not present

## 2013-06-29 DIAGNOSIS — A09 Infectious gastroenteritis and colitis, unspecified: Secondary | ICD-10-CM | POA: Diagnosis not present

## 2013-06-29 DIAGNOSIS — Z79899 Other long term (current) drug therapy: Secondary | ICD-10-CM | POA: Diagnosis not present

## 2013-06-29 DIAGNOSIS — I129 Hypertensive chronic kidney disease with stage 1 through stage 4 chronic kidney disease, or unspecified chronic kidney disease: Secondary | ICD-10-CM | POA: Diagnosis not present

## 2013-06-29 DIAGNOSIS — Z950 Presence of cardiac pacemaker: Secondary | ICD-10-CM | POA: Diagnosis not present

## 2013-06-29 DIAGNOSIS — J441 Chronic obstructive pulmonary disease with (acute) exacerbation: Secondary | ICD-10-CM | POA: Diagnosis not present

## 2013-06-29 DIAGNOSIS — E876 Hypokalemia: Secondary | ICD-10-CM | POA: Diagnosis not present

## 2013-06-29 DIAGNOSIS — N189 Chronic kidney disease, unspecified: Secondary | ICD-10-CM | POA: Diagnosis not present

## 2013-06-29 DIAGNOSIS — F17200 Nicotine dependence, unspecified, uncomplicated: Secondary | ICD-10-CM | POA: Diagnosis not present

## 2013-06-29 DIAGNOSIS — N289 Disorder of kidney and ureter, unspecified: Secondary | ICD-10-CM | POA: Diagnosis not present

## 2013-06-29 DIAGNOSIS — E860 Dehydration: Secondary | ICD-10-CM | POA: Diagnosis not present

## 2013-06-29 DIAGNOSIS — R059 Cough, unspecified: Secondary | ICD-10-CM | POA: Diagnosis not present

## 2013-06-29 DIAGNOSIS — Z7902 Long term (current) use of antithrombotics/antiplatelets: Secondary | ICD-10-CM | POA: Diagnosis not present

## 2013-06-29 DIAGNOSIS — Z885 Allergy status to narcotic agent status: Secondary | ICD-10-CM | POA: Diagnosis not present

## 2013-06-30 DIAGNOSIS — R05 Cough: Secondary | ICD-10-CM | POA: Diagnosis not present

## 2013-06-30 DIAGNOSIS — R9431 Abnormal electrocardiogram [ECG] [EKG]: Secondary | ICD-10-CM | POA: Diagnosis not present

## 2013-06-30 DIAGNOSIS — R197 Diarrhea, unspecified: Secondary | ICD-10-CM | POA: Diagnosis not present

## 2013-06-30 DIAGNOSIS — R059 Cough, unspecified: Secondary | ICD-10-CM | POA: Diagnosis not present

## 2013-06-30 DIAGNOSIS — E860 Dehydration: Secondary | ICD-10-CM | POA: Diagnosis not present

## 2013-07-01 DIAGNOSIS — R197 Diarrhea, unspecified: Secondary | ICD-10-CM | POA: Diagnosis not present

## 2013-07-01 DIAGNOSIS — R05 Cough: Secondary | ICD-10-CM | POA: Diagnosis not present

## 2013-07-01 DIAGNOSIS — E860 Dehydration: Secondary | ICD-10-CM | POA: Diagnosis not present

## 2013-07-01 DIAGNOSIS — R059 Cough, unspecified: Secondary | ICD-10-CM | POA: Diagnosis not present

## 2013-07-02 DIAGNOSIS — R197 Diarrhea, unspecified: Secondary | ICD-10-CM | POA: Diagnosis not present

## 2013-07-02 DIAGNOSIS — R05 Cough: Secondary | ICD-10-CM | POA: Diagnosis not present

## 2013-07-02 DIAGNOSIS — E860 Dehydration: Secondary | ICD-10-CM | POA: Diagnosis not present

## 2013-07-02 DIAGNOSIS — R059 Cough, unspecified: Secondary | ICD-10-CM | POA: Diagnosis not present

## 2013-07-09 DIAGNOSIS — F17200 Nicotine dependence, unspecified, uncomplicated: Secondary | ICD-10-CM | POA: Diagnosis not present

## 2013-07-09 DIAGNOSIS — J438 Other emphysema: Secondary | ICD-10-CM | POA: Diagnosis not present

## 2013-07-09 DIAGNOSIS — R269 Unspecified abnormalities of gait and mobility: Secondary | ICD-10-CM | POA: Diagnosis not present

## 2013-07-09 DIAGNOSIS — K5289 Other specified noninfective gastroenteritis and colitis: Secondary | ICD-10-CM | POA: Diagnosis not present

## 2013-07-09 DIAGNOSIS — N189 Chronic kidney disease, unspecified: Secondary | ICD-10-CM | POA: Diagnosis not present

## 2013-07-09 DIAGNOSIS — N179 Acute kidney failure, unspecified: Secondary | ICD-10-CM | POA: Diagnosis not present

## 2013-07-09 DIAGNOSIS — F0390 Unspecified dementia without behavioral disturbance: Secondary | ICD-10-CM | POA: Diagnosis not present

## 2013-08-01 DIAGNOSIS — R269 Unspecified abnormalities of gait and mobility: Secondary | ICD-10-CM | POA: Diagnosis not present

## 2013-08-01 DIAGNOSIS — N189 Chronic kidney disease, unspecified: Secondary | ICD-10-CM | POA: Diagnosis not present

## 2013-08-01 DIAGNOSIS — K5289 Other specified noninfective gastroenteritis and colitis: Secondary | ICD-10-CM | POA: Diagnosis not present

## 2013-08-01 DIAGNOSIS — J438 Other emphysema: Secondary | ICD-10-CM | POA: Diagnosis not present

## 2013-08-01 DIAGNOSIS — N179 Acute kidney failure, unspecified: Secondary | ICD-10-CM | POA: Diagnosis not present

## 2013-08-01 DIAGNOSIS — F0390 Unspecified dementia without behavioral disturbance: Secondary | ICD-10-CM | POA: Diagnosis not present

## 2013-08-01 DIAGNOSIS — F17200 Nicotine dependence, unspecified, uncomplicated: Secondary | ICD-10-CM | POA: Diagnosis not present

## 2013-08-23 DIAGNOSIS — E782 Mixed hyperlipidemia: Secondary | ICD-10-CM | POA: Diagnosis not present

## 2013-08-23 DIAGNOSIS — I10 Essential (primary) hypertension: Secondary | ICD-10-CM | POA: Diagnosis not present

## 2013-08-23 DIAGNOSIS — E119 Type 2 diabetes mellitus without complications: Secondary | ICD-10-CM | POA: Diagnosis not present

## 2014-01-17 DIAGNOSIS — N179 Acute kidney failure, unspecified: Secondary | ICD-10-CM | POA: Diagnosis not present

## 2014-01-17 DIAGNOSIS — N189 Chronic kidney disease, unspecified: Secondary | ICD-10-CM | POA: Diagnosis not present

## 2014-01-17 DIAGNOSIS — K5289 Other specified noninfective gastroenteritis and colitis: Secondary | ICD-10-CM | POA: Diagnosis not present

## 2014-01-17 DIAGNOSIS — J438 Other emphysema: Secondary | ICD-10-CM | POA: Diagnosis not present

## 2014-01-17 DIAGNOSIS — R269 Unspecified abnormalities of gait and mobility: Secondary | ICD-10-CM | POA: Diagnosis not present

## 2014-01-17 DIAGNOSIS — F17200 Nicotine dependence, unspecified, uncomplicated: Secondary | ICD-10-CM | POA: Diagnosis not present

## 2014-01-17 DIAGNOSIS — F0390 Unspecified dementia without behavioral disturbance: Secondary | ICD-10-CM | POA: Diagnosis not present

## 2014-04-26 DIAGNOSIS — K219 Gastro-esophageal reflux disease without esophagitis: Secondary | ICD-10-CM | POA: Diagnosis not present

## 2014-04-26 DIAGNOSIS — E782 Mixed hyperlipidemia: Secondary | ICD-10-CM | POA: Diagnosis not present

## 2014-04-26 DIAGNOSIS — N19 Unspecified kidney failure: Secondary | ICD-10-CM | POA: Diagnosis not present

## 2014-04-26 DIAGNOSIS — E1165 Type 2 diabetes mellitus with hyperglycemia: Secondary | ICD-10-CM | POA: Diagnosis not present

## 2014-04-26 DIAGNOSIS — I10 Essential (primary) hypertension: Secondary | ICD-10-CM | POA: Diagnosis not present

## 2014-05-02 DIAGNOSIS — Z23 Encounter for immunization: Secondary | ICD-10-CM | POA: Diagnosis not present

## 2014-05-02 DIAGNOSIS — K219 Gastro-esophageal reflux disease without esophagitis: Secondary | ICD-10-CM | POA: Diagnosis not present

## 2014-05-02 DIAGNOSIS — E1165 Type 2 diabetes mellitus with hyperglycemia: Secondary | ICD-10-CM | POA: Diagnosis not present

## 2014-05-02 DIAGNOSIS — E782 Mixed hyperlipidemia: Secondary | ICD-10-CM | POA: Diagnosis not present

## 2014-05-02 DIAGNOSIS — I10 Essential (primary) hypertension: Secondary | ICD-10-CM | POA: Diagnosis not present

## 2014-05-02 DIAGNOSIS — R2681 Unsteadiness on feet: Secondary | ICD-10-CM | POA: Diagnosis not present

## 2014-05-02 DIAGNOSIS — N182 Chronic kidney disease, stage 2 (mild): Secondary | ICD-10-CM | POA: Diagnosis not present

## 2014-05-19 ENCOUNTER — Encounter (HOSPITAL_COMMUNITY): Payer: Self-pay | Admitting: Internal Medicine

## 2014-10-21 DIAGNOSIS — K219 Gastro-esophageal reflux disease without esophagitis: Secondary | ICD-10-CM | POA: Diagnosis not present

## 2014-10-21 DIAGNOSIS — I714 Abdominal aortic aneurysm, without rupture: Secondary | ICD-10-CM | POA: Diagnosis not present

## 2014-10-21 DIAGNOSIS — F17210 Nicotine dependence, cigarettes, uncomplicated: Secondary | ICD-10-CM | POA: Diagnosis not present

## 2014-10-21 DIAGNOSIS — E1165 Type 2 diabetes mellitus with hyperglycemia: Secondary | ICD-10-CM | POA: Diagnosis not present

## 2014-10-21 DIAGNOSIS — E782 Mixed hyperlipidemia: Secondary | ICD-10-CM | POA: Diagnosis not present

## 2014-10-27 DIAGNOSIS — Z1389 Encounter for screening for other disorder: Secondary | ICD-10-CM | POA: Diagnosis not present

## 2014-10-27 DIAGNOSIS — K219 Gastro-esophageal reflux disease without esophagitis: Secondary | ICD-10-CM | POA: Diagnosis not present

## 2014-10-27 DIAGNOSIS — I639 Cerebral infarction, unspecified: Secondary | ICD-10-CM | POA: Diagnosis not present

## 2014-10-27 DIAGNOSIS — E782 Mixed hyperlipidemia: Secondary | ICD-10-CM | POA: Diagnosis not present

## 2014-10-27 DIAGNOSIS — F17210 Nicotine dependence, cigarettes, uncomplicated: Secondary | ICD-10-CM | POA: Diagnosis not present

## 2014-10-27 DIAGNOSIS — N183 Chronic kidney disease, stage 3 (moderate): Secondary | ICD-10-CM | POA: Diagnosis not present

## 2014-10-27 DIAGNOSIS — I6529 Occlusion and stenosis of unspecified carotid artery: Secondary | ICD-10-CM | POA: Diagnosis not present

## 2014-10-27 DIAGNOSIS — N400 Enlarged prostate without lower urinary tract symptoms: Secondary | ICD-10-CM | POA: Diagnosis not present

## 2014-10-27 DIAGNOSIS — I714 Abdominal aortic aneurysm, without rupture: Secondary | ICD-10-CM | POA: Diagnosis not present

## 2014-10-27 DIAGNOSIS — J439 Emphysema, unspecified: Secondary | ICD-10-CM | POA: Diagnosis not present

## 2014-10-27 DIAGNOSIS — E1165 Type 2 diabetes mellitus with hyperglycemia: Secondary | ICD-10-CM | POA: Diagnosis not present

## 2014-10-27 DIAGNOSIS — I10 Essential (primary) hypertension: Secondary | ICD-10-CM | POA: Diagnosis not present

## 2015-04-12 DIAGNOSIS — K219 Gastro-esophageal reflux disease without esophagitis: Secondary | ICD-10-CM | POA: Diagnosis not present

## 2015-04-12 DIAGNOSIS — N183 Chronic kidney disease, stage 3 (moderate): Secondary | ICD-10-CM | POA: Diagnosis not present

## 2015-04-12 DIAGNOSIS — I10 Essential (primary) hypertension: Secondary | ICD-10-CM | POA: Diagnosis not present

## 2015-04-12 DIAGNOSIS — E782 Mixed hyperlipidemia: Secondary | ICD-10-CM | POA: Diagnosis not present

## 2015-04-12 DIAGNOSIS — E1165 Type 2 diabetes mellitus with hyperglycemia: Secondary | ICD-10-CM | POA: Diagnosis not present

## 2015-04-17 DIAGNOSIS — N183 Chronic kidney disease, stage 3 (moderate): Secondary | ICD-10-CM | POA: Diagnosis not present

## 2015-04-17 DIAGNOSIS — Z23 Encounter for immunization: Secondary | ICD-10-CM | POA: Diagnosis not present

## 2015-04-17 DIAGNOSIS — E1165 Type 2 diabetes mellitus with hyperglycemia: Secondary | ICD-10-CM | POA: Diagnosis not present

## 2015-04-17 DIAGNOSIS — I639 Cerebral infarction, unspecified: Secondary | ICD-10-CM | POA: Diagnosis not present

## 2015-04-17 DIAGNOSIS — I10 Essential (primary) hypertension: Secondary | ICD-10-CM | POA: Diagnosis not present

## 2015-04-17 DIAGNOSIS — F17210 Nicotine dependence, cigarettes, uncomplicated: Secondary | ICD-10-CM | POA: Diagnosis not present

## 2015-04-17 DIAGNOSIS — I714 Abdominal aortic aneurysm, without rupture: Secondary | ICD-10-CM | POA: Diagnosis not present

## 2015-04-17 DIAGNOSIS — E782 Mixed hyperlipidemia: Secondary | ICD-10-CM | POA: Diagnosis not present

## 2015-04-17 DIAGNOSIS — I6529 Occlusion and stenosis of unspecified carotid artery: Secondary | ICD-10-CM | POA: Diagnosis not present

## 2015-04-17 DIAGNOSIS — N400 Enlarged prostate without lower urinary tract symptoms: Secondary | ICD-10-CM | POA: Diagnosis not present

## 2015-04-17 DIAGNOSIS — K219 Gastro-esophageal reflux disease without esophagitis: Secondary | ICD-10-CM | POA: Diagnosis not present

## 2015-04-17 DIAGNOSIS — J439 Emphysema, unspecified: Secondary | ICD-10-CM | POA: Diagnosis not present

## 2015-10-10 DIAGNOSIS — I10 Essential (primary) hypertension: Secondary | ICD-10-CM | POA: Diagnosis not present

## 2015-10-10 DIAGNOSIS — N183 Chronic kidney disease, stage 3 (moderate): Secondary | ICD-10-CM | POA: Diagnosis not present

## 2015-10-10 DIAGNOSIS — E1165 Type 2 diabetes mellitus with hyperglycemia: Secondary | ICD-10-CM | POA: Diagnosis not present

## 2015-10-10 DIAGNOSIS — E782 Mixed hyperlipidemia: Secondary | ICD-10-CM | POA: Diagnosis not present

## 2015-10-10 DIAGNOSIS — K219 Gastro-esophageal reflux disease without esophagitis: Secondary | ICD-10-CM | POA: Diagnosis not present

## 2015-11-03 DIAGNOSIS — N183 Chronic kidney disease, stage 3 (moderate): Secondary | ICD-10-CM | POA: Diagnosis not present

## 2015-11-03 DIAGNOSIS — I6529 Occlusion and stenosis of unspecified carotid artery: Secondary | ICD-10-CM | POA: Diagnosis not present

## 2015-11-03 DIAGNOSIS — E1165 Type 2 diabetes mellitus with hyperglycemia: Secondary | ICD-10-CM | POA: Diagnosis not present

## 2015-11-03 DIAGNOSIS — J439 Emphysema, unspecified: Secondary | ICD-10-CM | POA: Diagnosis not present

## 2015-11-03 DIAGNOSIS — F17210 Nicotine dependence, cigarettes, uncomplicated: Secondary | ICD-10-CM | POA: Diagnosis not present

## 2015-11-03 DIAGNOSIS — E782 Mixed hyperlipidemia: Secondary | ICD-10-CM | POA: Diagnosis not present

## 2015-11-03 DIAGNOSIS — K219 Gastro-esophageal reflux disease without esophagitis: Secondary | ICD-10-CM | POA: Diagnosis not present

## 2015-11-03 DIAGNOSIS — I10 Essential (primary) hypertension: Secondary | ICD-10-CM | POA: Diagnosis not present

## 2015-11-22 ENCOUNTER — Encounter (HOSPITAL_COMMUNITY): Payer: Self-pay | Admitting: Emergency Medicine

## 2015-11-22 ENCOUNTER — Inpatient Hospital Stay (HOSPITAL_COMMUNITY)
Admission: EM | Admit: 2015-11-22 | Discharge: 2015-11-27 | DRG: 871 | Disposition: A | Discharge status: Home / Self Care | Payer: Medicare Other | Attending: Internal Medicine | Admitting: Internal Medicine

## 2015-11-22 ENCOUNTER — Emergency Department (HOSPITAL_COMMUNITY): Payer: Medicare Other

## 2015-11-22 DIAGNOSIS — N401 Enlarged prostate with lower urinary tract symptoms: Secondary | ICD-10-CM | POA: Diagnosis present

## 2015-11-22 DIAGNOSIS — M545 Low back pain, unspecified: Secondary | ICD-10-CM

## 2015-11-22 DIAGNOSIS — F0280 Dementia in other diseases classified elsewhere without behavioral disturbance: Secondary | ICD-10-CM | POA: Diagnosis present

## 2015-11-22 DIAGNOSIS — K265 Chronic or unspecified duodenal ulcer with perforation: Secondary | ICD-10-CM | POA: Diagnosis present

## 2015-11-22 DIAGNOSIS — Z8673 Personal history of transient ischemic attack (TIA), and cerebral infarction without residual deficits: Secondary | ICD-10-CM | POA: Diagnosis not present

## 2015-11-22 DIAGNOSIS — K631 Perforation of intestine (nontraumatic): Secondary | ICD-10-CM

## 2015-11-22 DIAGNOSIS — J438 Other emphysema: Secondary | ICD-10-CM | POA: Diagnosis not present

## 2015-11-22 DIAGNOSIS — Z4682 Encounter for fitting and adjustment of non-vascular catheter: Secondary | ICD-10-CM | POA: Diagnosis not present

## 2015-11-22 DIAGNOSIS — F17210 Nicotine dependence, cigarettes, uncomplicated: Secondary | ICD-10-CM | POA: Diagnosis present

## 2015-11-22 DIAGNOSIS — G309 Alzheimer's disease, unspecified: Secondary | ICD-10-CM | POA: Diagnosis present

## 2015-11-22 DIAGNOSIS — J439 Emphysema, unspecified: Secondary | ICD-10-CM

## 2015-11-22 DIAGNOSIS — R338 Other retention of urine: Secondary | ICD-10-CM | POA: Diagnosis present

## 2015-11-22 DIAGNOSIS — E162 Hypoglycemia, unspecified: Secondary | ICD-10-CM | POA: Diagnosis present

## 2015-11-22 DIAGNOSIS — I10 Essential (primary) hypertension: Secondary | ICD-10-CM | POA: Diagnosis present

## 2015-11-22 DIAGNOSIS — K271 Acute peptic ulcer, site unspecified, with perforation: Secondary | ICD-10-CM | POA: Diagnosis not present

## 2015-11-22 DIAGNOSIS — I442 Atrioventricular block, complete: Secondary | ICD-10-CM | POA: Diagnosis present

## 2015-11-22 DIAGNOSIS — G8929 Other chronic pain: Secondary | ICD-10-CM | POA: Diagnosis present

## 2015-11-22 DIAGNOSIS — Z66 Do not resuscitate: Secondary | ICD-10-CM | POA: Diagnosis present

## 2015-11-22 DIAGNOSIS — J449 Chronic obstructive pulmonary disease, unspecified: Secondary | ICD-10-CM | POA: Diagnosis present

## 2015-11-22 DIAGNOSIS — I2510 Atherosclerotic heart disease of native coronary artery without angina pectoris: Secondary | ICD-10-CM | POA: Diagnosis not present

## 2015-11-22 DIAGNOSIS — Z7982 Long term (current) use of aspirin: Secondary | ICD-10-CM

## 2015-11-22 DIAGNOSIS — M47816 Spondylosis without myelopathy or radiculopathy, lumbar region: Secondary | ICD-10-CM | POA: Diagnosis not present

## 2015-11-22 DIAGNOSIS — Z7902 Long term (current) use of antithrombotics/antiplatelets: Secondary | ICD-10-CM | POA: Diagnosis not present

## 2015-11-22 DIAGNOSIS — H409 Unspecified glaucoma: Secondary | ICD-10-CM | POA: Diagnosis present

## 2015-11-22 DIAGNOSIS — R1013 Epigastric pain: Secondary | ICD-10-CM | POA: Diagnosis not present

## 2015-11-22 DIAGNOSIS — A419 Sepsis, unspecified organism: Secondary | ICD-10-CM | POA: Diagnosis not present

## 2015-11-22 DIAGNOSIS — E785 Hyperlipidemia, unspecified: Secondary | ICD-10-CM | POA: Diagnosis present

## 2015-11-22 DIAGNOSIS — R5383 Other fatigue: Secondary | ICD-10-CM | POA: Diagnosis not present

## 2015-11-22 DIAGNOSIS — Z950 Presence of cardiac pacemaker: Secondary | ICD-10-CM

## 2015-11-22 DIAGNOSIS — K8020 Calculus of gallbladder without cholecystitis without obstruction: Secondary | ICD-10-CM | POA: Diagnosis not present

## 2015-11-22 DIAGNOSIS — Z4659 Encounter for fitting and adjustment of other gastrointestinal appliance and device: Secondary | ICD-10-CM

## 2015-11-22 DIAGNOSIS — E860 Dehydration: Secondary | ICD-10-CM | POA: Diagnosis not present

## 2015-11-22 DIAGNOSIS — R05 Cough: Secondary | ICD-10-CM | POA: Diagnosis not present

## 2015-11-22 LAB — TYPE AND SCREEN
ABO/RH(D): O POS
ANTIBODY SCREEN: NEGATIVE

## 2015-11-22 LAB — PROTIME-INR
INR: 1.23 (ref 0.00–1.49)
INR: 1.28 (ref 0.00–1.49)
PROTHROMBIN TIME: 15.7 s — AB (ref 11.6–15.2)
PROTHROMBIN TIME: 16.1 s — AB (ref 11.6–15.2)

## 2015-11-22 LAB — APTT: APTT: 32 s (ref 24–37)

## 2015-11-22 LAB — URINALYSIS, ROUTINE W REFLEX MICROSCOPIC
BILIRUBIN URINE: NEGATIVE
GLUCOSE, UA: NEGATIVE mg/dL
Ketones, ur: NEGATIVE mg/dL
Leukocytes, UA: NEGATIVE
Nitrite: NEGATIVE
PH: 5.5 (ref 5.0–8.0)
Protein, ur: 30 mg/dL — AB

## 2015-11-22 LAB — CBC
HCT: 41 % (ref 39.0–52.0)
HEMOGLOBIN: 13.9 g/dL (ref 13.0–17.0)
MCH: 30.2 pg (ref 26.0–34.0)
MCHC: 33.9 g/dL (ref 30.0–36.0)
MCV: 89.1 fL (ref 78.0–100.0)
Platelets: 334 10*3/uL (ref 150–400)
RBC: 4.6 MIL/uL (ref 4.22–5.81)
RDW: 13.3 % (ref 11.5–15.5)
WBC: 20.3 10*3/uL — ABNORMAL HIGH (ref 4.0–10.5)

## 2015-11-22 LAB — COMPREHENSIVE METABOLIC PANEL
ALBUMIN: 3.6 g/dL (ref 3.5–5.0)
ALK PHOS: 69 U/L (ref 38–126)
ALT: 17 U/L (ref 17–63)
ANION GAP: 11 (ref 5–15)
AST: 28 U/L (ref 15–41)
BUN: 50 mg/dL — ABNORMAL HIGH (ref 6–20)
CALCIUM: 9.2 mg/dL (ref 8.9–10.3)
CHLORIDE: 97 mmol/L — AB (ref 101–111)
CO2: 27 mmol/L (ref 22–32)
Creatinine, Ser: 1.98 mg/dL — ABNORMAL HIGH (ref 0.61–1.24)
GFR calc non Af Amer: 28 mL/min — ABNORMAL LOW (ref >60)
GFR, EST AFRICAN AMERICAN: 33 mL/min — AB (ref >60)
GLUCOSE: 181 mg/dL — AB (ref 65–99)
Potassium: 3.9 mmol/L (ref 3.5–5.1)
SODIUM: 135 mmol/L (ref 135–145)
Total Bilirubin: 0.6 mg/dL (ref 0.3–1.2)
Total Protein: 7 g/dL (ref 6.5–8.1)

## 2015-11-22 LAB — LIPASE, BLOOD: Lipase: 18 U/L (ref 11–51)

## 2015-11-22 LAB — URINE MICROSCOPIC-ADD ON

## 2015-11-22 LAB — TROPONIN I: Troponin I: <0.03 ng/mL (ref <0.031)

## 2015-11-22 LAB — PROCALCITONIN: Procalcitonin: 1.11 ng/mL

## 2015-11-22 LAB — LACTIC ACID, PLASMA: Lactic Acid, Venous: 1.1 mmol/L (ref 0.5–2.0)

## 2015-11-22 LAB — I-STAT CG4 LACTIC ACID, ED: Lactic Acid, Venous: 3.98 mmol/L (ref 0.5–2.0)

## 2015-11-22 LAB — POC OCCULT BLOOD, ED: FECAL OCCULT BLD: POSITIVE — AB

## 2015-11-22 MED ORDER — ONDANSETRON HCL 4 MG PO TABS: 4.0000 mg | ORAL_TABLET | Freq: Four times a day (QID) | ORAL | Status: DC | PRN

## 2015-11-22 MED ORDER — DIPHENHYDRAMINE HCL 50 MG/ML IJ SOLN
12.5000 mg | Freq: Once | INTRAMUSCULAR | Status: AC
  Administered 2015-11-22: 12.5 mg via INTRAVENOUS
  Filled 2015-11-22: qty 1

## 2015-11-22 MED ORDER — SODIUM CHLORIDE 0.9 % IV BOLUS (SEPSIS)
1000.0000 mL | Freq: Once | INTRAVENOUS | Status: AC
  Administered 2015-11-22: 1000 mL via INTRAVENOUS

## 2015-11-22 MED ORDER — ACETAMINOPHEN 650 MG RE SUPP: 650.0000 mg | Freq: Four times a day (QID) | RECTAL | Status: DC | PRN

## 2015-11-22 MED ORDER — POTASSIUM CHLORIDE 2 MEQ/ML IV SOLN
INTRAVENOUS | Status: DC
  Administered 2015-11-22: 22:00:00 via INTRAVENOUS
  Filled 2015-11-22 (×2): qty 1000

## 2015-11-22 MED ORDER — MORPHINE SULFATE (PF) 2 MG/ML IV SOLN
2.0000 mg | INTRAVENOUS | Status: DC | PRN
  Administered 2015-11-22 – 2015-11-24 (×6): 2 mg via INTRAVENOUS
  Filled 2015-11-22 (×6): qty 1

## 2015-11-22 MED ORDER — SODIUM CHLORIDE 0.9 % IV SOLN: INTRAVENOUS | Status: AC

## 2015-11-22 MED ORDER — ONDANSETRON HCL 4 MG/2ML IJ SOLN
4.0000 mg | Freq: Four times a day (QID) | INTRAMUSCULAR | Status: DC | PRN
  Administered 2015-11-24 – 2015-11-25 (×3): 4 mg via INTRAVENOUS
  Filled 2015-11-22 (×3): qty 2

## 2015-11-22 MED ORDER — ACETAMINOPHEN 325 MG PO TABS: 650.0000 mg | ORAL_TABLET | Freq: Four times a day (QID) | ORAL | Status: DC | PRN

## 2015-11-22 MED ORDER — PIPERACILLIN-TAZOBACTAM 3.375 G IVPB
3.3750 g | Freq: Once | INTRAVENOUS | Status: AC
  Administered 2015-11-22: 3.375 g via INTRAVENOUS
  Filled 2015-11-22: qty 50

## 2015-11-22 MED ORDER — SODIUM CHLORIDE 0.9% FLUSH
3.0000 mL | Freq: Two times a day (BID) | INTRAVENOUS | Status: DC
  Administered 2015-11-23 – 2015-11-26 (×4): 3 mL via INTRAVENOUS

## 2015-11-22 MED ORDER — PANTOPRAZOLE SODIUM 40 MG IV SOLR
INTRAVENOUS | Status: AC
  Filled 2015-11-22: qty 80

## 2015-11-22 MED ORDER — PIPERACILLIN-TAZOBACTAM 3.375 G IVPB
3.3750 g | Freq: Three times a day (TID) | INTRAVENOUS | Status: DC
  Administered 2015-11-22 – 2015-11-27 (×14): 3.375 g via INTRAVENOUS
  Filled 2015-11-22 (×12): qty 50

## 2015-11-22 MED ORDER — PIPERACILLIN-TAZOBACTAM 3.375 G IVPB 30 MIN: 3.3750 g | Freq: Three times a day (TID) | INTRAVENOUS | Status: DC

## 2015-11-22 MED ORDER — IPRATROPIUM-ALBUTEROL 0.5-2.5 (3) MG/3ML IN SOLN
3.0000 mL | Freq: Three times a day (TID) | RESPIRATORY_TRACT | Status: DC
  Administered 2015-11-22 – 2015-11-25 (×6): 3 mL via RESPIRATORY_TRACT
  Filled 2015-11-22 (×6): qty 3

## 2015-11-22 MED ORDER — SODIUM CHLORIDE 0.9 % IV SOLN
8.0000 mg/h | INTRAVENOUS | Status: DC
  Administered 2015-11-22 – 2015-11-26 (×7): 8 mg/h via INTRAVENOUS
  Filled 2015-11-22 (×10): qty 80

## 2015-11-22 MED ORDER — FAMOTIDINE IN NACL 20-0.9 MG/50ML-% IV SOLN
20.0000 mg | Freq: Once | INTRAVENOUS | Status: AC
  Administered 2015-11-22: 20 mg via INTRAVENOUS
  Filled 2015-11-22: qty 50

## 2015-11-22 NOTE — ED Notes (Signed)
Pt reports abd pain and back pain for last several days. Pt family reports pt has also had diarrhea for several days prior to that. Pt alert,pale, generalized weakness noted.

## 2015-11-22 NOTE — H&P (Signed)
History and Physical  Bruce French [id number] DOB: 01-Mar-1926 DOA: 11/22/2015   PCP: Curlene Labrum, MD  Referring Physician: ED/ Dr.  Outpatient Specialists:   Patient coming from: Home  Chief Complaint: abdominal pain  HPI:  Bruce French is a 82 y.o. male with medical history of COPD, cognitive impairment, hypertension, complete heart block status post PPM presented with 4 day history of worsening abdominal pain and chronic lower back pain. The patient states that he normally takes hydrocodone for his low back pain, but this was discontinued this week, and the patient was placed on a prednisone taper of which he started taking on 11/21/2015. The patient denies any NSAIDs or other over-the-counter remedies. He denied any recent injuries or falls. At baseline, the patient uses a cane to ambulate. He has been having melanotic stools for the past week, but denies any hematochezia or hematemesis.  Patient denies fevers, chills, headache, chest pain, dyspnea. Patient had one episode of nausea and vomiting yesterday without any blood. Last bowel movement was 11/21/2015.  In the Emergency department, patient was afebrile with initially some soft blood pressures 89/60 which improved with fluid resuscitation. The patient received 3 L normal saline with improvement of 110/62. Serum creatinine was 1.98. WBC was 20.3. Lactic acid was 3.98. Chest x-ray showed left basilar scar. CT abdomen and pelvis revealed small extraluminal air bubble surrounding hiatus hernia. There was also irregular thickened wall in the distal stomach and on with soft tissue stranding. There was extraluminal air noted in the area around the liver. His aortobiiliac graft was without any aneurysm or hematoma.  LFTs and lipase are normal.   Assessment/Plan: Perforated gastric/duodenal ulcer -Dr. Arnoldo Morale was consulted and has evaluated patient -keep npo -continue zosyn -IVF -pain control -protonix  drip  Sepsis -presented with leukocytosis, hypotension and elevated lactate -IV zosyn -received 30cc/kg IVF -continue IVF -trend lactate -procalcitonin  Melena -likely upper GI bleed -start protonix drip  COPD/tobacco abuse ->60 pk year hx -tobacco cessation discussed -deferred nicoderm patch -duoneb q 8  CAD -no chest pain -EKG -hold ASA/Plavix for now  Complete heart block -s/p PPM -remain on tele  HTN -hold valsartan/HCTZ due to hypotension  Cognitive impairment -resume aricept when able to take po  BPH -hold avodart until able to take po          Past Medical History  Diagnosis Date  . Blurry vision, right eye   . Alzheimer disease   . Cataract, right eye   . Hypertension   . Hyperlipidemia   . Enlarged prostate   . Cataract extraction status of right eye   . Stroke ([place])   . Coronary artery disease   . Glaucoma   . Heart block     s/p PPM INSERT   Past Surgical History  Procedure Laterality Date  . Insert / replace / remove pacemaker  04/13/04    INSERT PPM [employer]/Model Number:  [id number]     PPM Serial Number:  [id number]  . Permanent pacemaker generator change N/A 01/07/2013    Procedure: PERMANENT PACEMAKER GENERATOR CHANGE;  Surgeon: Evans Lance, MD;  Location: [facility] CATH LAB;  Service: Cardiovascular;  Laterality: N/A;   Social History:  reports that he has been smoking Cigarettes.  He has a 80 pack-year smoking history. He does not have any smokeless tobacco history on file. His alcohol and drug histories are not on file.  Family History: History reviewed. No pertinent family history.   Allergies  Allergen Reactions  . Xanax Xr [Alprazolam Er] Other (See Comments)    "he was out for 4 days."     Prior to Admission medications   Medication Sig Start Date End Date Taking? Authorizing Provider  Ascorbic Acid (VITAMIN C) 1000 MG tablet Take 1,000 mg by mouth daily.   Yes Historical Provider, MD  aspirin EC 81 MG tablet Take 81  mg by mouth daily.   Yes Historical Provider, MD  atorvastatin (LIPITOR) 20 MG tablet Take 20 mg by mouth daily.   Yes Historical Provider, MD  Cholecalciferol (VITAMIN D-3) 5000 UNITS TABS Take 5,000 Units by mouth daily.   Yes Historical Provider, MD  clopidogrel (PLAVIX) 75 MG tablet Take 75 mg by mouth daily.   Yes Historical Provider, MD  donepezil (ARICEPT) 10 MG tablet Take 10 mg by mouth daily.   Yes Historical Provider, MD  DULoxetine (CYMBALTA) 30 MG capsule Take 30 mg by mouth daily.   Yes Historical Provider, MD  dutasteride (AVODART) 0.5 MG capsule Take 0.5 mg by mouth daily.   Yes Historical Provider, MD  Multiple Vitamin (MULTIVITAMIN WITH MINERALS) TABS Take 1 tablet by mouth daily.   Yes Historical Provider, MD  pantoprazole (PROTONIX) 40 MG tablet Take 40 mg by mouth daily.   Yes Historical Provider, MD  predniSONE (DELTASONE) 10 MG tablet Take 60 mg by mouth daily.   Yes Historical Provider, MD  valsartan-hydrochlorothiazide (DIOVAN-HCT) 160-12.5 MG per tablet Take 1 tablet by mouth daily.   Yes Historical Provider, MD    Review of Systems:  Constitutional:  No weight loss, night sweats, Fevers, chills Head&Eyes: No headache.  No vision loss.   ENT:  No Difficulty swallowing,Tooth/dental problems,Sore throat,  No ear ache, post nasal drip,  Cardio-vascular:  No chest pain, Orthopnea, PND, swelling in lower extremities GI:  No  abdominal pain, nausea, vomiting, diarrhea, loss of appetite, hematochezia,  Resp:  No shortness of breath with exertion or at rest.No coughing up of blood .No wheezing. Skin:  no rash or lesions.  GU:  no dysuria, change in color of urine, no urgency or frequency. No flank pain.  Musculoskeletal:  No joint pain or swelling. No decreased range of motion.  Psych:  No change in mood or affect.  Neurologic: No headache, , no vision loss. No syncope  Physical Exam: Filed Vitals:   11/22/15 1700 11/22/15 1710 11/22/15 1754 11/22/15 1804   BP: 105/57 110/62    Pulse: 59 68 62 68  Temp:      TempSrc:      Resp: 21 19 23 20   Height:      Weight:      SpO2: 96% 96% 97% 94%   General:  A&O x 3, NAD, nontoxic, pleasant/cooperative Head/Eye: No conjunctival hemorrhage, no icterus, [place]/AT, No nystagmus ENT:  No icterus,  No thrush, good dentition, no pharyngeal exudate Neck:  No masses, no lymphadenpathy, no bruits CV:  RRR, no rub, no gallop, no S3 Lung: bibasilar rales L>R without wheeze Abdomen: soft/epigastric pain without rebound, +BS, nondistended, no peritoneal signs Ext: No cyanosis, No rashes, No petechiae, No lymphangitis, No edema Neuro:  CN II-XII intact, strength 4/5 in RUE, RLE, strength 4/5 LUE, LLE; sensation intact bilateral; no dysmetria;     Labs on Admission:  Basic Metabolic Panel:  Recent Labs Lab 11/22/15 1601  NA 135  K 3.9  CL 97*  CO2 27  GLUCOSE 181*  BUN 50*  CREATININE 1.98*  CALCIUM 9.2   Liver  Function Tests:  Recent Labs Lab 11/22/15 1601  AST 28  ALT 17  ALKPHOS 69  BILITOT 0.6  PROT 7.0  ALBUMIN 3.6    Recent Labs Lab 11/22/15 1601  LIPASE 18   No results for input(s): AMMONIA in the last 168 hours. CBC:  Recent Labs Lab 11/22/15 1601  WBC 20.3*  HGB 13.9  HCT 41.0  MCV 89.1  PLT 334   Coagulation Profile:  Recent Labs Lab 11/22/15 1650  INR 1.23   Cardiac Enzymes:  Recent Labs Lab 11/22/15 1650  TROPONINI <0.03   BNP: Invalid input(s): POCBNP CBG: No results for input(s): GLUCAP in the last 168 hours. Urine analysis:    Component Value Date/Time   COLORURINE AMBER* 11/22/2015 1724   APPEARANCEUR CLEAR 11/22/2015 1724   LABSPEC >1.030* 11/22/2015 1724   PHURINE 5.5 11/22/2015 1724   GLUCOSEU NEGATIVE 11/22/2015 1724   HGBUR TRACE* 11/22/2015 1724   BILIRUBINUR NEGATIVE 11/22/2015 1724   KETONESUR NEGATIVE 11/22/2015 1724   PROTEINUR 30* 11/22/2015 1724   NITRITE NEGATIVE 11/22/2015 1724   LEUKOCYTESUR NEGATIVE 11/22/2015 1724    Sepsis Labs: @LABRCNTIP (procalcitonin:4,lacticidven:4) )No results found for this or any previous visit (from the past 240 hour(s)).   Radiological Exams on Admission: Ct Abdomen Pelvis Wo Contrast  11/22/2015  CLINICAL DATA:  Abdominal and back pain for several days. Diarrhea with generalized weakness. Evaluate abdominal aortic aneurysm. EXAM: CT ABDOMEN AND PELVIS WITHOUT CONTRAST TECHNIQUE: Multidetector CT imaging of the abdomen and pelvis was performed following the standard protocol without IV contrast. COMPARISON:  CT 04/13/2011 and 10/10/2006 FINDINGS: Lower chest: Dependent atelectasis or scarring at both lung bases, improved from the prior examination. There is mild left lower lobe bronchiectasis. The heart is mildly enlarged. The patient has a pacemaker. There is a small hiatal hernia with soft tissue stranding in the surrounding fat. There are also several small extraluminal air bubbles surrounding the hiatal hernia. Hepatobiliary: The liver appears unremarkable as imaged in the noncontrast state. There are small dependent gallstones. No gallbladder wall thickening or significant biliary dilatation. Pancreas: Unremarkable. No pancreatic ductal dilatation or surrounding inflammatory changes. Spleen: Normal in size without focal abnormality. Adrenals/Urinary Tract: Stable low-density bilateral adrenal adenomas. There are probable cysts in the interpolar region of the left kidney. The right kidney appears normal. No evidence of urinary tract calculus or hydronephrosis. The bladder is thick walled with bilateral diverticula, similar to the prior examination. Stomach/Bowel: As above, there is a hiatal hernia with soft tissue stranding in the surrounding fat and adjacent extraluminal air bubbles. No enteric contrast was administered. There is some high-density material within the colonic lumen. No extraluminal contrast is demonstrated. There is irregular wall thickening of the distal stomach and  duodenum with surrounding soft tissue stranding. There are extraluminal air bubbles around the liver suspicious for duodenal micro perforation. There is no focal extraluminal fluid collection. Diverticular changes are present throughout the colon, especially distally. No evidence acute inflammation. The appendix appears normal. Vascular/Lymphatic: There are no enlarged abdominal or pelvic lymph nodes. There is diffuse atherosclerosis of the aorta and its branch vessels status post interval aorto bi-iliac grafting. No evidence of recurrent aneurysm or retroperitoneal hematoma. Reproductive: Stable mild enlargement of the prostate gland. Other: No evidence of abdominal wall mass or hernia. As above, there are inflammatory changes in the right upper quadrant of the abdomen with extraluminal fluid and air bubbles suspicious for duodenal perforation. No significant ascites. Musculoskeletal: No acute or significant osseous findings. Pectus deformity of the  sternum and lower lumbar spondylosis noted. IMPRESSION: 1. Right upper quadrant inflammatory process associated with wall thickening of the distal stomach and duodenum and multiple small extraluminal air bubbles, most consistent with perforation of a duodenal ulcer. No free air or focal fluid collection. 2. Interval aorto bi-iliac grafting without evidence of recurrent aneurysm or retroperitoneal hematoma. Vascular patency cannot be addressed without contrast. 3. Cholelithiasis without evidence of cholecystitis. 4. Sigmoid diverticulosis, adrenal adenomas and left renal cysts. 5. Chronic bladder wall thickening and diverticula, likely due to chronic bladder outlet obstruction. 6. These results were called by telephone at the time of interpretation on 11/22/2015 at 8:01 pm to Dr. Ezequiel Essex , who verbally acknowledged these results. Electronically Signed   By: Richardean Sale M.D.   On: 11/22/2015 20:03   Dg Chest Portable 1 View  11/22/2015  CLINICAL DATA:   Weakness, cough. EXAM: PORTABLE CHEST 1 VIEW COMPARISON:  Radiograph of January 05, 2013. FINDINGS: The heart size and mediastinal contours are within normal limits. Left-sided pacemaker is unchanged in position. No pneumothorax is noted. Right lung is clear. Stable left basilar scarring is noted. The visualized skeletal structures are unremarkable. IMPRESSION: No acute cardiopulmonary abnormality seen. Electronically Signed   By: Marijo Conception, M.D.   On: 11/22/2015 16:00    EKG: Independently reviewed. pending    Time spent: 60 minutes Code Status:   DNR Family Communication:  Daughter updated at bedside Disposition Plan: expect 3-4 day hospitalization Consults called: general sugery DVT Prophylaxis:  SCDs  Bruce Roussel, DO  [facility] Pager [phone]  If 7PM-7AM, please contact night-coverage [email or web address] Password [email or web address] 11/22/2015, 9:01 PM

## 2015-11-22 NOTE — ED Provider Notes (Signed)
CSN: [id number]     Arrival date & time 11/22/15  1515 History   First MD Initiated Contact with Patient 11/22/15 1531     Chief Complaint  Patient presents with  . Abdominal Pain     (Consider location/radiation/quality/duration/timing/severity/associated sxs/prior Treatment) HPI Comments: Level V caveat for dementia. Patient presents with ongoing abdominal pain for the past several days. Family reports that he vomited one episode last night after taking prednisone which she was started on for chronic back pain. Patient states he has diffuse chronic back pain at baseline that is unchanged. Daughter reports lower abdominal pain worsening over the past several days. They deny any diarrhea. She does not know when his last bowel movement was. No fever. No focal weakness, numbness or tingling. Patient's stools have been dark. He is on aspirin and Plavix but no Coumadin. Patient had a abdominal aneurysm repaired emergently 2006. Denies any focal weakness, numbness or tingling. Denies any loss of consciousness.   The history is provided by the patient and a relative. The history is limited by the condition of the patient.    Past Medical History  Diagnosis Date  . Blurry vision, right eye   . Alzheimer disease   . Cataract, right eye   . Hypertension   . Hyperlipidemia   . Enlarged prostate   . Cataract extraction status of right eye   . Stroke ([place])   . Coronary artery disease   . Glaucoma   . Heart block     s/p PPM INSERT   Past Surgical History  Procedure Laterality Date  . Insert / replace / remove pacemaker  04/13/04    INSERT PPM [employer]/Model Number:  [id number]     PPM Serial Number:  [id number]  . Permanent pacemaker generator change N/A 01/07/2013    Procedure: PERMANENT PACEMAKER GENERATOR CHANGE;  Surgeon: Evans Lance, MD;  Location: [facility] CATH LAB;  Service: Cardiovascular;  Laterality: N/A;   History reviewed. No pertinent family history. Social History  Substance Use  Topics  . Smoking status: Current Every Day Smoker -- 1.00 packs/day for 80 years    Types: Cigarettes  . Smokeless tobacco: None  . Alcohol Use: None    Review of Systems  Constitutional: Positive for activity change, appetite change and fatigue. Negative for fever.  HENT: Negative for congestion and rhinorrhea.   Respiratory: Negative for cough, chest tightness and shortness of breath.   Cardiovascular: Negative for chest pain.  Gastrointestinal: Positive for abdominal pain, diarrhea and blood in stool. Negative for nausea and vomiting.  Genitourinary: Negative for dysuria, hematuria and testicular pain.  Musculoskeletal: Positive for myalgias, back pain and arthralgias.  Skin: Negative for rash.  Neurological: Positive for weakness.  A complete 10 system review of systems was obtained and all systems are negative except as noted in the HPI and PMH.      Allergies  Xanax xr  Home Medications   Prior to Admission medications   Medication Sig Start Date End Date Taking? Authorizing Provider  Ascorbic Acid (VITAMIN C) 1000 MG tablet Take 1,000 mg by mouth daily.    Historical Provider, MD  aspirin EC 81 MG tablet Take 81 mg by mouth daily.    Historical Provider, MD  atorvastatin (LIPITOR) 20 MG tablet Take 20 mg by mouth daily.    Historical Provider, MD  Cholecalciferol (VITAMIN D-3) 5000 UNITS TABS Take 5,000 Units by mouth daily.    Historical Provider, MD  clopidogrel (PLAVIX) 75 MG tablet Take 75  mg by mouth daily.    Historical Provider, MD  donepezil (ARICEPT) 10 MG tablet Take 10 mg by mouth daily.    Historical Provider, MD  dutasteride (AVODART) 0.5 MG capsule Take 0.5 mg by mouth daily.    Historical Provider, MD  esomeprazole (NEXIUM) 40 MG capsule Take 40 mg by mouth daily before breakfast.    Historical Provider, MD  HYDROcodone-acetaminophen (VICODIN) 5-500 MG per tablet Take 1 tablet by mouth every 6 (six) hours as needed for pain.    Historical Provider, MD   Multiple Vitamin (MULTIVITAMIN WITH MINERALS) TABS Take 1 tablet by mouth daily.    Historical Provider, MD  Omega-3 Fatty Acids (FISH OIL) 1000 MG CAPS Take 2,000 mg by mouth daily.    Historical Provider, MD  valsartan-hydrochlorothiazide (DIOVAN-HCT) 160-12.5 MG per tablet Take 1 tablet by mouth daily.    Historical Provider, MD   BP 89/60 mmHg  Pulse 104  Temp(Src) 97.7 F (36.5 C) (Oral)  Resp 18  Ht 5\' 8"  (1.727 m)  Wt 155 lb (70.308 kg)  BMI 23.57 kg/m2  SpO2 98% Physical Exam  Constitutional: He is oriented to person, place, and time. He appears well-developed and well-nourished. No distress.  Dry mucus membranes, fatigued appearing  HENT:  Head: Normocephalic and atraumatic.  Mouth/Throat: Oropharynx is clear and moist. No oropharyngeal exudate.  Eyes: Conjunctivae and EOM are normal. Pupils are equal, round, and reactive to light.  Neck: Normal range of motion. Neck supple.  No meningismus.  Cardiovascular: Normal rate, regular rhythm, normal heart sounds and intact distal pulses.   No murmur heard. Pulmonary/Chest: Effort normal and breath sounds normal. No respiratory distress.  Abdominal: Soft. There is tenderness. There is no rebound and no guarding.  Well-healed midline incision, no guarding or rebound, intact femoral pulses, unable to palpate DP or PT pulses bilaterally  Genitourinary:  Dark stool on rectal exam  Musculoskeletal: Normal range of motion. He exhibits tenderness. He exhibits no edema.  Paraspinal lumbar tenderness PT pulses intact with doppler  Neurological: He is alert and oriented to person, place, and time. No cranial nerve deficit. He exhibits normal muscle tone. Coordination normal.  No ataxia on finger to nose bilaterally. No pronator drift. 5/5 strength throughout. CN 2-12 intact.Equal grip strength. Sensation intact.   Skin: Skin is warm.  Psychiatric: He has a normal mood and affect. His behavior is normal.  Nursing note and vitals  reviewed.   ED Course  Procedures (including critical care time) Labs Review Labs Reviewed  COMPREHENSIVE METABOLIC PANEL - Abnormal; Notable for the following:    Chloride 97 (*)    Glucose, Bld 181 (*)    BUN 50 (*)    Creatinine, Ser 1.98 (*)    GFR calc non Af Amer 28 (*)    GFR calc Af Amer 33 (*)    All other components within normal limits  CBC - Abnormal; Notable for the following:    WBC 20.3 (*)    All other components within normal limits  URINALYSIS, ROUTINE W REFLEX MICROSCOPIC (NOT AT [facility]) - Abnormal; Notable for the following:    Color, Urine AMBER (*)    Specific Gravity, Urine >1.030 (*)    Hgb urine dipstick TRACE (*)    Protein, ur 30 (*)    All other components within normal limits  PROTIME-INR - Abnormal; Notable for the following:    Prothrombin Time 15.7 (*)    All other components within normal limits  URINE MICROSCOPIC-ADD ON -  Abnormal; Notable for the following:    Squamous Epithelial / LPF 0-5 (*)    Bacteria, UA MANY (*)    Casts HYALINE CASTS (*)    All other components within normal limits  PROTIME-INR - Abnormal; Notable for the following:    Prothrombin Time 16.1 (*)    All other components within normal limits  I-STAT CG4 LACTIC ACID, ED - Abnormal; Notable for the following:    Lactic Acid, Venous 3.98 (*)    All other components within normal limits  POC OCCULT BLOOD, ED - Abnormal; Notable for the following:    Fecal Occult Bld POSITIVE (*)    All other components within normal limits  C DIFFICILE QUICK SCREEN W PCR REFLEX  GASTROINTESTINAL PANEL BY PCR, STOOL (REPLACES STOOL CULTURE)  MRSA PCR SCREENING  CULTURE, BLOOD (ROUTINE X 2)  CULTURE, BLOOD (ROUTINE X 2)  LIPASE, BLOOD  TROPONIN I  APTT  BASIC METABOLIC PANEL  CBC  LACTIC ACID, PLASMA  LACTIC ACID, PLASMA  PROCALCITONIN  I-STAT CG4 LACTIC ACID, ED  TYPE AND SCREEN    Imaging Review Ct Abdomen Pelvis Wo Contrast  11/22/2015  CLINICAL DATA:  Abdominal and back  pain for several days. Diarrhea with generalized weakness. Evaluate abdominal aortic aneurysm. EXAM: CT ABDOMEN AND PELVIS WITHOUT CONTRAST TECHNIQUE: Multidetector CT imaging of the abdomen and pelvis was performed following the standard protocol without IV contrast. COMPARISON:  CT 04/13/2011 and 10/10/2006 FINDINGS: Lower chest: Dependent atelectasis or scarring at both lung bases, improved from the prior examination. There is mild left lower lobe bronchiectasis. The heart is mildly enlarged. The patient has a pacemaker. There is a small hiatal hernia with soft tissue stranding in the surrounding fat. There are also several small extraluminal air bubbles surrounding the hiatal hernia. Hepatobiliary: The liver appears unremarkable as imaged in the noncontrast state. There are small dependent gallstones. No gallbladder wall thickening or significant biliary dilatation. Pancreas: Unremarkable. No pancreatic ductal dilatation or surrounding inflammatory changes. Spleen: Normal in size without focal abnormality. Adrenals/Urinary Tract: Stable low-density bilateral adrenal adenomas. There are probable cysts in the interpolar region of the left kidney. The right kidney appears normal. No evidence of urinary tract calculus or hydronephrosis. The bladder is thick walled with bilateral diverticula, similar to the prior examination. Stomach/Bowel: As above, there is a hiatal hernia with soft tissue stranding in the surrounding fat and adjacent extraluminal air bubbles. No enteric contrast was administered. There is some high-density material within the colonic lumen. No extraluminal contrast is demonstrated. There is irregular wall thickening of the distal stomach and duodenum with surrounding soft tissue stranding. There are extraluminal air bubbles around the liver suspicious for duodenal micro perforation. There is no focal extraluminal fluid collection. Diverticular changes are present throughout the colon, especially  distally. No evidence acute inflammation. The appendix appears normal. Vascular/Lymphatic: There are no enlarged abdominal or pelvic lymph nodes. There is diffuse atherosclerosis of the aorta and its branch vessels status post interval aorto bi-iliac grafting. No evidence of recurrent aneurysm or retroperitoneal hematoma. Reproductive: Stable mild enlargement of the prostate gland. Other: No evidence of abdominal wall mass or hernia. As above, there are inflammatory changes in the right upper quadrant of the abdomen with extraluminal fluid and air bubbles suspicious for duodenal perforation. No significant ascites. Musculoskeletal: No acute or significant osseous findings. Pectus deformity of the sternum and lower lumbar spondylosis noted. IMPRESSION: 1. Right upper quadrant inflammatory process associated with wall thickening of the distal stomach and duodenum  and multiple small extraluminal air bubbles, most consistent with perforation of a duodenal ulcer. No free air or focal fluid collection. 2. Interval aorto bi-iliac grafting without evidence of recurrent aneurysm or retroperitoneal hematoma. Vascular patency cannot be addressed without contrast. 3. Cholelithiasis without evidence of cholecystitis. 4. Sigmoid diverticulosis, adrenal adenomas and left renal cysts. 5. Chronic bladder wall thickening and diverticula, likely due to chronic bladder outlet obstruction. 6. These results were called by telephone at the time of interpretation on 11/22/2015 at 8:01 pm to Dr. Ezequiel Essex , who verbally acknowledged these results. Electronically Signed   By: Richardean Sale M.D.   On: 11/22/2015 20:03   Dg Chest Portable 1 View  11/22/2015  CLINICAL DATA:  Weakness, cough. EXAM: PORTABLE CHEST 1 VIEW COMPARISON:  Radiograph of January 05, 2013. FINDINGS: The heart size and mediastinal contours are within normal limits. Left-sided pacemaker is unchanged in position. No pneumothorax is noted. Right lung is clear. Stable  left basilar scarring is noted. The visualized skeletal structures are unremarkable. IMPRESSION: No acute cardiopulmonary abnormality seen. Electronically Signed   By: Marijo Conception, M.D.   On: 11/22/2015 16:00   I have personally reviewed and evaluated these images and lab results as part of my medical decision-making.   EKG Interpretation None      MDM   Final diagnoses:  Perforated duodenal ulcer ([place])   Patient with chronic back pain presenting with new abdominal pain and dark stools. History of previous aortic aneurysm repair.  creatinine 1.98. Lactate 3.9. Unable to give IV contrast due to renal failure. FOBT positive with melena. Hemoglobin stable.  BP improved with IVF.   No AAA seen on bedside US. CT with contained duodenal ulcer perforation. No free air.no complication of previous AAA repair. D/w Dr. Arnoldo Morale who recommends IVF, NGT, IV zosyn. He will see.  Admission d/w Dr. Carles Collet.  Will start protonix gtt.  EMERGENCY DEPARTMENT [place] ABD/AORTA EXAM Study: Limited Ultrasound of the Abdominal Aorta.  INDICATIONS:Abdominal pain, Back pain and Age>55 Indication: Multiple views of the abdominal aorta are obtained from the diaphragmatic hiatus to the aortic bifurcation in transverse and sagittal planes with a multi- Frequency probe.  PERFORMED BY: Myself  IMAGES ARCHIVED?: Yes  FINDINGS: Maximum aortic dimensions are 2.5  LIMITATIONS:  Body habitus and Abdominal pain  INTERPRETATION:  No abdominal aortic aneurysm and Abdominal free fluid absent  COMMENT:     CRITICAL CARE Performed by: Ezequiel Essex Total critical care time: 45 minutes Critical care time was exclusive of separately billable procedures and treating other patients. Critical care was necessary to treat or prevent imminent or life-threatening deterioration. Critical care was time spent personally by me on the following activities: development of treatment plan with patient and/or surrogate as well  as nursing, discussions with consultants, evaluation of patient's response to treatment, examination of patient, obtaining history from patient or surrogate, ordering and performing treatments and interventions, ordering and review of laboratory studies, ordering and review of radiographic studies, pulse oximetry and re-evaluation of patient's condition.   Ezequiel Essex, MD 11/22/15 2330

## 2015-11-22 NOTE — Consult Note (Signed)
Reason for Consult: Perforated peptic ulcer Referring Physician: Dr. Meryl Dare is an 82 y.o. male.  HPI: Patient is an 82 year old white male who presented emergency room with worsening epigastric pain. He was found to be hypotensive with an elevated lactic acid level and leukocytosis. CT scan of the abdomen with oral contrast reveals a perforated peptic ulcer at the gastroduodenal border, though only specks of air were noted. There was no free pneumoperitoneum or fluid collection. Patient denies history of BC powder or Goody powder use. He was started on steroids yesterday for back pain.  Had an episode of emesis yesterday evening. He stays with a daughter. He came to the emergency room because he continued to have a stomachache. No fever or chills were noted. He has no history of peptic ulcer disease.  Past Medical History  Diagnosis Date  . Blurry vision, right eye   . Alzheimer disease   . Cataract, right eye   . Hypertension   . Hyperlipidemia   . Enlarged prostate   . Cataract extraction status of right eye   . Stroke ([place])   . Coronary artery disease   . Glaucoma   . Heart block     s/p PPM INSERT    Past Surgical History  Procedure Laterality Date  . Insert / replace / remove pacemaker  04/13/04    INSERT PPM [employer]/Model Number:  [id number]     PPM Serial Number:  [id number]  . Permanent pacemaker generator change N/A 01/07/2013    Procedure: PERMANENT PACEMAKER GENERATOR CHANGE;  Surgeon: Evans Lance, MD;  Location: [facility] CATH LAB;  Service: Cardiovascular;  Laterality: N/A;    History reviewed. No pertinent family history.  Social History:  reports that he has been smoking Cigarettes.  He has a 80 pack-year smoking history. He does not have any smokeless tobacco history on file. His alcohol and drug histories are not on file.  Allergies:  Allergies  Allergen Reactions  . Xanax Xr [Alprazolam Er] Other (See Comments)    "he was out for 4 days."     Medications:  Prior to Admission:  Prescriptions prior to admission  Medication Sig Dispense Refill Last Dose  . Ascorbic Acid (VITAMIN C) 1000 MG tablet Take 1,000 mg by mouth daily.   11/21/2015 at Unknown time  . aspirin EC 81 MG tablet Take 81 mg by mouth daily.   11/21/2015 at Unknown time  . atorvastatin (LIPITOR) 20 MG tablet Take 20 mg by mouth daily.   11/21/2015 at Unknown time  . Cholecalciferol (VITAMIN D-3) 5000 UNITS TABS Take 5,000 Units by mouth daily.   11/21/2015 at Unknown time  . clopidogrel (PLAVIX) 75 MG tablet Take 75 mg by mouth daily.   11/21/2015 at Unknown time  . donepezil (ARICEPT) 10 MG tablet Take 10 mg by mouth daily.   11/21/2015 at Unknown time  . DULoxetine (CYMBALTA) 30 MG capsule Take 30 mg by mouth daily.   11/21/2015 at Unknown time  . dutasteride (AVODART) 0.5 MG capsule Take 0.5 mg by mouth daily.   11/21/2015 at Unknown time  . Multiple Vitamin (MULTIVITAMIN WITH MINERALS) TABS Take 1 tablet by mouth daily.   11/21/2015 at Unknown time  . pantoprazole (PROTONIX) 40 MG tablet Take 40 mg by mouth daily.   11/21/2015 at Unknown time  . predniSONE (DELTASONE) 10 MG tablet Take 60 mg by mouth daily.   11/21/2015 at Unknown time  . valsartan-hydrochlorothiazide (DIOVAN-HCT) 160-12.5 MG per tablet Take 1  tablet by mouth daily.   11/21/2015 at Unknown time   Scheduled: . sodium chloride   Intravenous STAT  . sodium chloride  1,000 mL Intravenous Once  . sodium chloride  1,000 mL Intravenous Once    Results for orders placed or performed during the hospital encounter of 11/22/15 (from the past 48 hour(s))  Comprehensive metabolic panel     Status: Abnormal   Collection Time: 11/22/15  4:01 PM  Result Value Ref Range   Sodium 135 135 - 145 mmol/L   Potassium 3.9 3.5 - 5.1 mmol/L   Chloride 97 (L) 101 - 111 mmol/L   CO2 27 22 - 32 mmol/L   Glucose, Bld 181 (H) 65 - 99 mg/dL   BUN 50 (H) 6 - 20 mg/dL   Creatinine, Ser 1.98 (H) 0.61 - 1.24 mg/dL   Calcium 9.2  8.9 - 10.3 mg/dL   Total Protein 7.0 6.5 - 8.1 g/dL   Albumin 3.6 3.5 - 5.0 g/dL   AST 28 15 - 41 U/L   ALT 17 17 - 63 U/L   Alkaline Phosphatase 69 38 - 126 U/L   Total Bilirubin 0.6 0.3 - 1.2 mg/dL   GFR calc non Af Amer 28 (L) >60 mL/min   GFR calc Af Amer 33 (L) >60 mL/min    Comment: (NOTE) The eGFR has been calculated using the CKD EPI equation. This calculation has not been validated in all clinical situations. eGFR's persistently <60 mL/min signify possible Chronic Kidney Disease.    Anion gap 11 5 - 15  CBC     Status: Abnormal   Collection Time: 11/22/15  4:01 PM  Result Value Ref Range   WBC 20.3 (H) 4.0 - 10.5 K/uL   RBC 4.60 4.22 - 5.81 MIL/uL   Hemoglobin 13.9 13.0 - 17.0 g/dL   HCT 41.0 39.0 - 52.0 %   MCV 89.1 78.0 - 100.0 fL   MCH 30.2 26.0 - 34.0 pg   MCHC 33.9 30.0 - 36.0 g/dL   RDW 13.3 11.5 - 15.5 %   Platelets 334 150 - 400 K/uL  Lipase, blood     Status: None   Collection Time: 11/22/15  4:01 PM  Result Value Ref Range   Lipase 18 11 - 51 U/L  Type and screen [facility]     Status: None   Collection Time: 11/22/15  4:08 PM  Result Value Ref Range   ABO/RH(D) O POS    Antibody Screen NEG    Sample Expiration 11/25/2015   POC occult blood, ED Provider will collect     Status: Abnormal   Collection Time: 11/22/15  4:23 PM  Result Value Ref Range   Fecal Occult Bld POSITIVE (A) NEGATIVE  Protime-INR     Status: Abnormal   Collection Time: 11/22/15  4:50 PM  Result Value Ref Range   Prothrombin Time 15.7 (H) 11.6 - 15.2 seconds   INR 1.23 0.00 - 1.49  Troponin I     Status: None   Collection Time: 11/22/15  4:50 PM  Result Value Ref Range   Troponin I <0.03 <0.031 ng/mL    Comment:        NO INDICATION OF MYOCARDIAL INJURY.   Urinalysis, Routine w reflex microscopic     Status: Abnormal   Collection Time: 11/22/15  5:24 PM  Result Value Ref Range   Color, Urine AMBER (A) YELLOW    Comment: BIOCHEMICALS MAY BE AFFECTED BY COLOR    APPearance  CLEAR CLEAR   Specific Gravity, Urine >1.030 (H) 1.005 - 1.030   pH 5.5 5.0 - 8.0   Glucose, UA NEGATIVE NEGATIVE mg/dL   Hgb urine dipstick TRACE (A) NEGATIVE   Bilirubin Urine NEGATIVE NEGATIVE   Ketones, ur NEGATIVE NEGATIVE mg/dL   Protein, ur 30 (A) NEGATIVE mg/dL   Nitrite NEGATIVE NEGATIVE   Leukocytes, UA NEGATIVE NEGATIVE  Urine microscopic-add on     Status: Abnormal   Collection Time: 11/22/15  5:24 PM  Result Value Ref Range   Squamous Epithelial / LPF 0-5 (A) NONE SEEN   WBC, UA 0-5 0 - 5 WBC/hpf   RBC / HPF 6-30 0 - 5 RBC/hpf   Bacteria, UA MANY (A) NONE SEEN   Casts HYALINE CASTS (A) NEGATIVE    Comment: GRANULAR CAST  I-Stat CG4 Lactic Acid, ED     Status: Abnormal   Collection Time: 11/22/15  5:59 PM  Result Value Ref Range   Lactic Acid, Venous 3.98 (HH) 0.5 - 2.0 mmol/L   Comment NOTIFIED PHYSICIAN     Ct Abdomen Pelvis Wo Contrast  11/22/2015  CLINICAL DATA:  Abdominal and back pain for several days. Diarrhea with generalized weakness. Evaluate abdominal aortic aneurysm. EXAM: CT ABDOMEN AND PELVIS WITHOUT CONTRAST TECHNIQUE: Multidetector CT imaging of the abdomen and pelvis was performed following the standard protocol without IV contrast. COMPARISON:  CT 04/13/2011 and 10/10/2006 FINDINGS: Lower chest: Dependent atelectasis or scarring at both lung bases, improved from the prior examination. There is mild left lower lobe bronchiectasis. The heart is mildly enlarged. The patient has a pacemaker. There is a small hiatal hernia with soft tissue stranding in the surrounding fat. There are also several small extraluminal air bubbles surrounding the hiatal hernia. Hepatobiliary: The liver appears unremarkable as imaged in the noncontrast state. There are small dependent gallstones. No gallbladder wall thickening or significant biliary dilatation. Pancreas: Unremarkable. No pancreatic ductal dilatation or surrounding inflammatory changes. Spleen: Normal in size  without focal abnormality. Adrenals/Urinary Tract: Stable low-density bilateral adrenal adenomas. There are probable cysts in the interpolar region of the left kidney. The right kidney appears normal. No evidence of urinary tract calculus or hydronephrosis. The bladder is thick walled with bilateral diverticula, similar to the prior examination. Stomach/Bowel: As above, there is a hiatal hernia with soft tissue stranding in the surrounding fat and adjacent extraluminal air bubbles. No enteric contrast was administered. There is some high-density material within the colonic lumen. No extraluminal contrast is demonstrated. There is irregular wall thickening of the distal stomach and duodenum with surrounding soft tissue stranding. There are extraluminal air bubbles around the liver suspicious for duodenal micro perforation. There is no focal extraluminal fluid collection. Diverticular changes are present throughout the colon, especially distally. No evidence acute inflammation. The appendix appears normal. Vascular/Lymphatic: There are no enlarged abdominal or pelvic lymph nodes. There is diffuse atherosclerosis of the aorta and its branch vessels status post interval aorto bi-iliac grafting. No evidence of recurrent aneurysm or retroperitoneal hematoma. Reproductive: Stable mild enlargement of the prostate gland. Other: No evidence of abdominal wall mass or hernia. As above, there are inflammatory changes in the right upper quadrant of the abdomen with extraluminal fluid and air bubbles suspicious for duodenal perforation. No significant ascites. Musculoskeletal: No acute or significant osseous findings. Pectus deformity of the sternum and lower lumbar spondylosis noted. IMPRESSION: 1. Right upper quadrant inflammatory process associated with wall thickening of the distal stomach and duodenum and multiple small extraluminal air bubbles, most  consistent with perforation of a duodenal ulcer. No free air or focal fluid  collection. 2. Interval aorto bi-iliac grafting without evidence of recurrent aneurysm or retroperitoneal hematoma. Vascular patency cannot be addressed without contrast. 3. Cholelithiasis without evidence of cholecystitis. 4. Sigmoid diverticulosis, adrenal adenomas and left renal cysts. 5. Chronic bladder wall thickening and diverticula, likely due to chronic bladder outlet obstruction. 6. These results were called by telephone at the time of interpretation on 11/22/2015 at 8:01 pm to Dr. Ezequiel Essex , who verbally acknowledged these results. Electronically Signed   By: Richardean Sale M.D.   On: 11/22/2015 20:03   Dg Chest Portable 1 View  11/22/2015  CLINICAL DATA:  Weakness, cough. EXAM: PORTABLE CHEST 1 VIEW COMPARISON:  Radiograph of January 05, 2013. FINDINGS: The heart size and mediastinal contours are within normal limits. Left-sided pacemaker is unchanged in position. No pneumothorax is noted. Right lung is clear. Stable left basilar scarring is noted. The visualized skeletal structures are unremarkable. IMPRESSION: No acute cardiopulmonary abnormality seen. Electronically Signed   By: Marijo Conception, M.D.   On: 11/22/2015 16:00    ROS:  A comprehensive review of systems was negative except for: Gastrointestinal: positive for abdominal pain Due to his dementia, review of systems is somewhat difficult get. Blood pressure 107/48, pulse 63, temperature 97.7 F (36.5 C), temperature source Oral, resp. rate 23, height _0  (1.727 m), weight 70.308 kg (155 lb), SpO2 95 %. Physical Exam: Well-nourished white male in no acute distress. Head is normocephalic, atraumatic. Neck is supple without lymphadenopathy or JVD. Heart examination reveals a pacemaker in place. Abdomen is soft, nontender, nondistended. No hepatosplenomegaly or masses are noted. He has a large midline surgical scar present. Rectal examination performed previously showed blood in stool.  Assessment/Plan: Impression: Acute  peptic ulcer perforation secondary to steroid use, contained. Patient does not have peritonitis and no contrast extravasation on CT scan. Agree with admission for fluid resuscitation, IV Zosyn, NG tube decompression, and IV Protonix. No need for acute surgical intervention. Hopefully this will resolve without surgery. Will follow with you.  Jonah Gingras A 11/22/2015, 9:21 PM

## 2015-11-23 ENCOUNTER — Inpatient Hospital Stay (HOSPITAL_COMMUNITY): Payer: Medicare Other

## 2015-11-23 DIAGNOSIS — K265 Chronic or unspecified duodenal ulcer with perforation: Secondary | ICD-10-CM

## 2015-11-23 DIAGNOSIS — A419 Sepsis, unspecified organism: Principal | ICD-10-CM

## 2015-11-23 LAB — CBC
HEMATOCRIT: 31.9 % — AB (ref 39.0–52.0)
HEMOGLOBIN: 10.8 g/dL — AB (ref 13.0–17.0)
MCH: 30.7 pg (ref 26.0–34.0)
MCHC: 33.9 g/dL (ref 30.0–36.0)
MCV: 90.6 fL (ref 78.0–100.0)
Platelets: 235 10*3/uL (ref 150–400)
RBC: 3.52 MIL/uL — AB (ref 4.22–5.81)
RDW: 13.6 % (ref 11.5–15.5)
WBC: 13.3 10*3/uL — ABNORMAL HIGH (ref 4.0–10.5)

## 2015-11-23 LAB — BASIC METABOLIC PANEL
ANION GAP: 7 (ref 5–15)
BUN: 44 mg/dL — ABNORMAL HIGH (ref 6–20)
CALCIUM: 8 mg/dL — AB (ref 8.9–10.3)
CO2: 25 mmol/L (ref 22–32)
Chloride: 107 mmol/L (ref 101–111)
Creatinine, Ser: 1.59 mg/dL — ABNORMAL HIGH (ref 0.61–1.24)
GFR, EST AFRICAN AMERICAN: 43 mL/min — AB (ref >60)
GFR, EST NON AFRICAN AMERICAN: 37 mL/min — AB (ref >60)
GLUCOSE: 97 mg/dL (ref 65–99)
POTASSIUM: 3.7 mmol/L (ref 3.5–5.1)
Sodium: 139 mmol/L (ref 135–145)

## 2015-11-23 LAB — LACTIC ACID, PLASMA: LACTIC ACID, VENOUS: 0.7 mmol/L (ref 0.5–2.0)

## 2015-11-23 LAB — MRSA PCR SCREENING: MRSA by PCR: NEGATIVE

## 2015-11-23 MED ORDER — MENTHOL 3 MG MT LOZG
1.0000 | LOZENGE | OROMUCOSAL | Status: DC | PRN
  Administered 2015-11-23: 3 mg via ORAL
  Filled 2015-11-23: qty 9

## 2015-11-23 MED ORDER — POTASSIUM CHLORIDE IN NACL 20-0.9 MEQ/L-% IV SOLN
INTRAVENOUS | Status: DC
  Administered 2015-11-24: 04:00:00 via INTRAVENOUS

## 2015-11-23 NOTE — Progress Notes (Signed)
Informed patient that MD wanted a foley catheter inserted for urinary retention. Pt stated he might be able to void if he was standing up. Assisted patient to a standing position on the side of the bed and patient was able to void 500cc of amber colored urine. Will hold off on placing indwelling catheter at this time.

## 2015-11-23 NOTE — Progress Notes (Signed)
Bladder scan revealed 573cc of urine in the bladder. MD notified. New orders received and carried out.

## 2015-11-23 NOTE — Progress Notes (Signed)
Called report to [facility], LPN on dept [id number]. Verbalized understanding. Pt transferred to room 302 in safe and stable condition.

## 2015-11-23 NOTE — Progress Notes (Signed)
PROGRESS NOTE    Bruce French  [id number] DOB: 05/23/1926 DOA: 11/22/2015 PCP: Curlene Labrum, MD     Brief Narrative:  82 year old man admitted on 6/14 with epigastric pain. Was found to have on CT scan a perforated peptic ulcer at the gastroduodenal border. Has been seen in consultation by Dr. Arnoldo Morale and plans for conservative therapy as present.   Assessment & Plan:   Active Problems:   Perforated duodenal ulcer ([place])   Perforated ulcer of intestine ([facility])   Sepsis ([place])   Emphysema lung ([place])   Coronary artery disease   Sepsis -Due to perforated gastric/duodenal ulcer. -Sepsis parameters have improved.  Perforated gastric/duodenal ulcer -Continue IV Zosyn, IV Protonix, avoid nausea and retching with antiemetics. -Keep nothing by mouth, NG to suction. -Has been seen in consultation by Dr. Arnoldo Morale, no emergent need for surgery at present is his opinion.  COPD/tobacco abuse -Refused nicotine patch, cessation counseling provided.  Coronary artery disease  -stable, no chest pain, aspirin and Plavix on hold due to possibility of surgery.  Complete heart block -Status post permanent pacemaker.  Acute urinary retention/BPH -Foley catheter has been placed, Avodart on hold due to hypotension. Consider initiating tomorrow.   DVT prophylaxis: SCDs Code Status: DO NOT RESUSCITATE Family Communication: Multiple family members at bedside updated on plan of care Disposition Plan: Transfer to floor  Consultants:   Surgery  Procedures:   None  Antimicrobials:   Zosyn    Subjective: Still with mild abdominal pain, no nausea.  Objective: Filed Vitals:   11/23/15 0800 11/23/15 0900 11/23/15 1000 11/23/15 1100  BP: 101/56 106/52 112/54 107/49  Pulse: 64 59 61 61  Temp:      TempSrc:      Resp: 23 14 19 19   Height:      Weight:      SpO2: 98% 98% 96% 95%    Intake/Output Summary (Last 24 hours) at 11/23/15 1318 Last data filed at 11/23/15 [id number]  Gross per 24 hour  Intake      0 ml  Output   1000 ml  Net  -1000 ml   Filed Weights   11/22/15 1527 11/22/15 2147 11/23/15 0500  Weight: 70.308 kg (155 lb) 65.3 kg (143 lb 15.4 oz) 65.3 kg (143 lb 15.4 oz)    Examination:  General exam: Alert, awake, oriented x 3 Respiratory system: Clear to auscultation. Respiratory effort normal. Cardiovascular system:RRR. No murmurs, rubs, gallops. Gastrointestinal system: Abdomen is nondistended, soft and nontender. No organomegaly or masses felt. Normal bowel sounds heard. Central nervous system: Alert and oriented. No focal neurological deficits. Extremities: No C/C/E, +pedal pulses Skin: No rashes, lesions or ulcers Psychiatry: Judgement and insight appear normal. Mood & affect appropriate.     Data Reviewed: I have personally reviewed following labs and imaging studies  CBC:  Recent Labs Lab 11/22/15 1601 11/23/15 0404  WBC 20.3* 13.3*  HGB 13.9 10.8*  HCT 41.0 31.9*  MCV 89.1 90.6  PLT 334 [id number]   Basic Metabolic Panel:  Recent Labs Lab 11/22/15 1601 11/23/15 0404  NA 135 139  K 3.9 3.7  CL 97* 107  CO2 27 25  GLUCOSE 181* 97  BUN 50* 44*  CREATININE 1.98* 1.59*  CALCIUM 9.2 8.0*   GFR: Estimated Creatinine Clearance: 29.7 mL/min (by C-G formula based on Cr of 1.59). Liver Function Tests:  Recent Labs Lab 11/22/15 1601  AST 28  ALT 17  ALKPHOS 69  BILITOT 0.6  PROT 7.0  ALBUMIN 3.6  Recent Labs Lab 11/22/15 1601  LIPASE 18   No results for input(s): AMMONIA in the last 168 hours. Coagulation Profile:  Recent Labs Lab 11/22/15 1650 11/22/15 2210  INR 1.23 1.28   Cardiac Enzymes:  Recent Labs Lab 11/22/15 1650  TROPONINI <0.03   BNP (last 3 results) No results for input(s): PROBNP in the last 8760 hours. HbA1C: No results for input(s): HGBA1C in the last 72 hours. CBG: No results for input(s): GLUCAP in the last 168 hours. Lipid Profile: No results for input(s): CHOL, HDL, LDLCALC,  TRIG, CHOLHDL, LDLDIRECT in the last 72 hours. Thyroid Function Tests: No results for input(s): TSH, T4TOTAL, FREET4, T3FREE, THYROIDAB in the last 72 hours. Anemia Panel: No results for input(s): VITAMINB12, FOLATE, FERRITIN, TIBC, IRON, RETICCTPCT in the last 72 hours. Urine analysis:    Component Value Date/Time   COLORURINE AMBER* 11/22/2015 [place] 11/22/2015 1724   LABSPEC >1.030* 11/22/2015 1724   PHURINE 5.5 11/22/2015 1724   GLUCOSEU NEGATIVE 11/22/2015 1724   HGBUR TRACE* 11/22/2015 1724   [place] 11/22/2015 1724   KETONESUR NEGATIVE 11/22/2015 1724   PROTEINUR 30* 11/22/2015 1724   NITRITE NEGATIVE 11/22/2015 1724   LEUKOCYTESUR NEGATIVE 11/22/2015 1724   Sepsis Labs: @LABRCNTIP (procalcitonin:4,lacticidven:4)  ) Recent Results (from the past 240 hour(s))  MRSA PCR Screening     Status: None   Collection Time: 11/22/15  9:30 PM  Result Value Ref Range Status   MRSA by PCR NEGATIVE NEGATIVE Final    Comment:        The GeneXpert MRSA Assay (FDA approved for NASAL specimens only), is one component of a comprehensive MRSA colonization surveillance program. It is not intended to diagnose MRSA infection nor to guide or monitor treatment for MRSA infections.   Culture, blood (x 2)     Status: None (Preliminary result)   Collection Time: 11/22/15 10:10 PM  Result Value Ref Range Status   Specimen Description BLOOD LEFT ARM  Final   Special Requests BOTTLES DRAWN AEROBIC AND ANAEROBIC 4CC EACH  Final   Culture NO GROWTH < 12 HOURS  Final   Report Status PENDING  Incomplete  Culture, blood (x 2)     Status: None (Preliminary result)   Collection Time: 11/22/15 10:21 PM  Result Value Ref Range Status   Specimen Description BLOOD RIGHT ARM  Final   Special Requests BOTTLES DRAWN AEROBIC AND ANAEROBIC 6CC EACH  Final   Culture NO GROWTH < 12 HOURS  Final   Report Status PENDING  Incomplete         Radiology Studies: Ct Abdomen  Pelvis Wo Contrast  11/22/2015  CLINICAL DATA:  Abdominal and back pain for several days. Diarrhea with generalized weakness. Evaluate abdominal aortic aneurysm. EXAM: CT ABDOMEN AND PELVIS WITHOUT CONTRAST TECHNIQUE: Multidetector CT imaging of the abdomen and pelvis was performed following the standard protocol without IV contrast. COMPARISON:  CT 04/13/2011 and 10/10/2006 FINDINGS: Lower chest: Dependent atelectasis or scarring at both lung bases, improved from the prior examination. There is mild left lower lobe bronchiectasis. The heart is mildly enlarged. The patient has a pacemaker. There is a small hiatal hernia with soft tissue stranding in the surrounding fat. There are also several small extraluminal air bubbles surrounding the hiatal hernia. Hepatobiliary: The liver appears unremarkable as imaged in the noncontrast state. There are small dependent gallstones. No gallbladder wall thickening or significant biliary dilatation. Pancreas: Unremarkable. No pancreatic ductal dilatation or surrounding inflammatory changes. Spleen: Normal in  size without focal abnormality. Adrenals/Urinary Tract: Stable low-density bilateral adrenal adenomas. There are probable cysts in the interpolar region of the left kidney. The right kidney appears normal. No evidence of urinary tract calculus or hydronephrosis. The bladder is thick walled with bilateral diverticula, similar to the prior examination. Stomach/Bowel: As above, there is a hiatal hernia with soft tissue stranding in the surrounding fat and adjacent extraluminal air bubbles. No enteric contrast was administered. There is some high-density material within the colonic lumen. No extraluminal contrast is demonstrated. There is irregular wall thickening of the distal stomach and duodenum with surrounding soft tissue stranding. There are extraluminal air bubbles around the liver suspicious for duodenal micro perforation. There is no focal extraluminal fluid collection.  Diverticular changes are present throughout the colon, especially distally. No evidence acute inflammation. The appendix appears normal. Vascular/Lymphatic: There are no enlarged abdominal or pelvic lymph nodes. There is diffuse atherosclerosis of the aorta and its branch vessels status post interval aorto bi-iliac grafting. No evidence of recurrent aneurysm or retroperitoneal hematoma. Reproductive: Stable mild enlargement of the prostate gland. Other: No evidence of abdominal wall mass or hernia. As above, there are inflammatory changes in the right upper quadrant of the abdomen with extraluminal fluid and air bubbles suspicious for duodenal perforation. No significant ascites. Musculoskeletal: No acute or significant osseous findings. Pectus deformity of the sternum and lower lumbar spondylosis noted. IMPRESSION: 1. Right upper quadrant inflammatory process associated with wall thickening of the distal stomach and duodenum and multiple small extraluminal air bubbles, most consistent with perforation of a duodenal ulcer. No free air or focal fluid collection. 2. Interval aorto bi-iliac grafting without evidence of recurrent aneurysm or retroperitoneal hematoma. Vascular patency cannot be addressed without contrast. 3. Cholelithiasis without evidence of cholecystitis. 4. Sigmoid diverticulosis, adrenal adenomas and left renal cysts. 5. Chronic bladder wall thickening and diverticula, likely due to chronic bladder outlet obstruction. 6. These results were called by telephone at the time of interpretation on 11/22/2015 at 8:01 pm to Dr. Ezequiel Essex , who verbally acknowledged these results. Electronically Signed   By: Richardean Sale M.D.   On: 11/22/2015 20:03   Dg Chest Portable 1 View  11/22/2015  CLINICAL DATA:  Weakness, cough. EXAM: PORTABLE CHEST 1 VIEW COMPARISON:  Radiograph of January 05, 2013. FINDINGS: The heart size and mediastinal contours are within normal limits. Left-sided pacemaker is unchanged  in position. No pneumothorax is noted. Right lung is clear. Stable left basilar scarring is noted. The visualized skeletal structures are unremarkable. IMPRESSION: No acute cardiopulmonary abnormality seen. Electronically Signed   By: Marijo Conception, M.D.   On: 11/22/2015 16:00        Scheduled Meds: . sodium chloride   Intravenous STAT  . ipratropium-albuterol  3 mL Nebulization Q8H  . piperacillin-tazobactam (ZOSYN)  IV  3.375 g Intravenous Q8H  . sodium chloride flush  3 mL Intravenous Q12H   Continuous Infusions: . 0.9 % NaCl with KCl 20 mEq / L 75 mL/hr at 11/23/15 0800  . pantoprozole (PROTONIX) infusion 8 mg/hr (11/23/15 0902)     LOS: 1 day    Time spent: 25 minutes. Greater than 50% of this time was spent in direct contact with the patient coordinating care.     Lelon Frohlich, MD [facility] Pager [phone]  If 7PM-7AM, please contact night-coverage [email or web address] Password [email or web address] 11/23/2015, 1:18 PM

## 2015-11-23 NOTE — Progress Notes (Signed)
Subjective: Patient complains of nasal pain secondary to NG tube. Denies any abdominal pain.  Objective: Vital signs in last 24 hours: Temp:  [97.1 F (36.2 C)-98 F (36.7 C)] 97.1 F (36.2 C) (06/15 0400) Pulse Rate:  [35-104] 64 (06/15 0800) Resp:  [13-23] 23 (06/15 0800) BP: (89-128)/(44-65) 101/56 mmHg (06/15 0800) SpO2:  [94 %-100 %] 98 % (06/15 0800) Weight:  [65.3 kg (143 lb 15.4 oz)-70.308 kg (155 lb)] 65.3 kg (143 lb 15.4 oz) (06/15 0500) Last BM Date: 11/21/15  Intake/Output from previous day: 06/14 0701 - 06/15 0700 In: -  Out: 650 [Emesis/NG output:650] Intake/Output this shift: Total I/O In: -  Out: 350 [Emesis/NG output:350]  General appearance: cooperative and no distress Resp: clear to auscultation bilaterally Cardio: regular rate and rhythm, S1, S2 normal, no murmur, click, rub or gallop GI: soft, non-tender; bowel sounds normal; no masses,  no organomegaly  Lab Results:   Recent Labs  11/22/15 1601 11/23/15 0404  WBC 20.3* 13.3*  HGB 13.9 10.8*  HCT 41.0 31.9*  PLT 334 235   BMET  Recent Labs  11/22/15 1601 11/23/15 0404  NA 135 139  K 3.9 3.7  CL 97* 107  CO2 27 25  GLUCOSE 181* 97  BUN 50* 44*  CREATININE 1.98* 1.59*  CALCIUM 9.2 8.0*   PT/INR  Recent Labs  11/22/15 1650 11/22/15 2210  LABPROT 15.7* 16.1*  INR 1.23 1.28    Studies/Results: Ct Abdomen Pelvis Wo Contrast  11/22/2015  CLINICAL DATA:  Abdominal and back pain for several days. Diarrhea with generalized weakness. Evaluate abdominal aortic aneurysm. EXAM: CT ABDOMEN AND PELVIS WITHOUT CONTRAST TECHNIQUE: Multidetector CT imaging of the abdomen and pelvis was performed following the standard protocol without IV contrast. COMPARISON:  CT 04/13/2011 and 10/10/2006 FINDINGS: Lower chest: Dependent atelectasis or scarring at both lung bases, improved from the prior examination. There is mild left lower lobe bronchiectasis. The heart is mildly enlarged. The patient has a  pacemaker. There is a small hiatal hernia with soft tissue stranding in the surrounding fat. There are also several small extraluminal air bubbles surrounding the hiatal hernia. Hepatobiliary: The liver appears unremarkable as imaged in the noncontrast state. There are small dependent gallstones. No gallbladder wall thickening or significant biliary dilatation. Pancreas: Unremarkable. No pancreatic ductal dilatation or surrounding inflammatory changes. Spleen: Normal in size without focal abnormality. Adrenals/Urinary Tract: Stable low-density bilateral adrenal adenomas. There are probable cysts in the interpolar region of the left kidney. The right kidney appears normal. No evidence of urinary tract calculus or hydronephrosis. The bladder is thick walled with bilateral diverticula, similar to the prior examination. Stomach/Bowel: As above, there is a hiatal hernia with soft tissue stranding in the surrounding fat and adjacent extraluminal air bubbles. No enteric contrast was administered. There is some high-density material within the colonic lumen. No extraluminal contrast is demonstrated. There is irregular wall thickening of the distal stomach and duodenum with surrounding soft tissue stranding. There are extraluminal air bubbles around the liver suspicious for duodenal micro perforation. There is no focal extraluminal fluid collection. Diverticular changes are present throughout the colon, especially distally. No evidence acute inflammation. The appendix appears normal. Vascular/Lymphatic: There are no enlarged abdominal or pelvic lymph nodes. There is diffuse atherosclerosis of the aorta and its branch vessels status post interval aorto bi-iliac grafting. No evidence of recurrent aneurysm or retroperitoneal hematoma. Reproductive: Stable mild enlargement of the prostate gland. Other: No evidence of abdominal wall mass or hernia. As above, there are  inflammatory changes in the right upper quadrant of the  abdomen with extraluminal fluid and air bubbles suspicious for duodenal perforation. No significant ascites. Musculoskeletal: No acute or significant osseous findings. Pectus deformity of the sternum and lower lumbar spondylosis noted. IMPRESSION: 1. Right upper quadrant inflammatory process associated with wall thickening of the distal stomach and duodenum and multiple small extraluminal air bubbles, most consistent with perforation of a duodenal ulcer. No free air or focal fluid collection. 2. Interval aorto bi-iliac grafting without evidence of recurrent aneurysm or retroperitoneal hematoma. Vascular patency cannot be addressed without contrast. 3. Cholelithiasis without evidence of cholecystitis. 4. Sigmoid diverticulosis, adrenal adenomas and left renal cysts. 5. Chronic bladder wall thickening and diverticula, likely due to chronic bladder outlet obstruction. 6. These results were called by telephone at the time of interpretation on 11/22/2015 at 8:01 pm to Dr. Ezequiel Essex , who verbally acknowledged these results. Electronically Signed   By: Richardean Sale M.D.   On: 11/22/2015 20:03   Dg Chest Portable 1 View  11/22/2015  CLINICAL DATA:  Weakness, cough. EXAM: PORTABLE CHEST 1 VIEW COMPARISON:  Radiograph of January 05, 2013. FINDINGS: The heart size and mediastinal contours are within normal limits. Left-sided pacemaker is unchanged in position. No pneumothorax is noted. Right lung is clear. Stable left basilar scarring is noted. The visualized skeletal structures are unremarkable. IMPRESSION: No acute cardiopulmonary abnormality seen. Electronically Signed   By: Marijo Conception, M.D.   On: 11/22/2015 16:00    Anti-infectives: Anti-infectives    Start     Dose/Rate Route Frequency Ordered Stop   11/22/15 2200  piperacillin-tazobactam (ZOSYN) IVPB 3.375 g  Status:  Discontinued     3.375 g 100 mL/hr over 30 Minutes Intravenous Every 8 hours 11/22/15 2128 11/22/15 2133   11/22/15 2200   piperacillin-tazobactam (ZOSYN) IVPB 3.375 g     3.375 g 12.5 mL/hr over 240 Minutes Intravenous Every 8 hours 11/22/15 2134     11/22/15 2015  piperacillin-tazobactam (ZOSYN) IVPB 3.375 g     3.375 g 12.5 mL/hr over 240 Minutes Intravenous  Once 11/22/15 2005 11/22/15 2103      Assessment/Plan: Impression: Much improved, perforated peptic ulcer disease which appears contained. Lactic acid level elevation has resolved. Leukocytosis improved. No evidence of peritonitis. Plan: Continue current therapy. No need for acute surgical intervention.  LOS: 1 day    Arita Severtson A 11/23/2015

## 2015-11-24 ENCOUNTER — Inpatient Hospital Stay (HOSPITAL_COMMUNITY): Payer: Medicare Other

## 2015-11-24 LAB — CBC
HEMATOCRIT: 36.1 % — AB (ref 39.0–52.0)
Hemoglobin: 11.8 g/dL — ABNORMAL LOW (ref 13.0–17.0)
MCH: 30.2 pg (ref 26.0–34.0)
MCHC: 32.7 g/dL (ref 30.0–36.0)
MCV: 92.3 fL (ref 78.0–100.0)
Platelets: 236 10*3/uL (ref 150–400)
RBC: 3.91 MIL/uL — ABNORMAL LOW (ref 4.22–5.81)
RDW: 13.8 % (ref 11.5–15.5)
WBC: 12.9 10*3/uL — ABNORMAL HIGH (ref 4.0–10.5)

## 2015-11-24 LAB — GLUCOSE, CAPILLARY
GLUCOSE-CAPILLARY: 56 mg/dL — AB (ref 65–99)
GLUCOSE-CAPILLARY: 104 mg/dL — AB (ref 65–99)
GLUCOSE-CAPILLARY: 92 mg/dL (ref 65–99)
Glucose-Capillary: 86 mg/dL (ref 65–99)
Glucose-Capillary: 79 mg/dL (ref 65–99)

## 2015-11-24 LAB — BASIC METABOLIC PANEL
Anion gap: 7 (ref 5–15)
BUN: 34 mg/dL — ABNORMAL HIGH (ref 6–20)
CO2: 22 mmol/L (ref 22–32)
CREATININE: 1.39 mg/dL — AB (ref 0.61–1.24)
Calcium: 8.1 mg/dL — ABNORMAL LOW (ref 8.9–10.3)
Chloride: 112 mmol/L — ABNORMAL HIGH (ref 101–111)
GFR calc non Af Amer: 44 mL/min — ABNORMAL LOW (ref >60)
GFR, EST AFRICAN AMERICAN: 51 mL/min — AB (ref >60)
Glucose, Bld: 58 mg/dL — ABNORMAL LOW (ref 65–99)
Potassium: 4 mmol/L (ref 3.5–5.1)
SODIUM: 141 mmol/L (ref 135–145)

## 2015-11-24 MED ORDER — KCL IN DEXTROSE-NACL 10-5-0.45 MEQ/L-%-% IV SOLN
INTRAVENOUS | Status: DC
  Administered 2015-11-24 – 2015-11-26 (×4): via INTRAVENOUS
  Filled 2015-11-24 (×6): qty 1000

## 2015-11-24 MED ORDER — DEXTROSE-NACL 5-0.45 % IV SOLN: INTRAVENOUS | Status: DC

## 2015-11-24 MED ORDER — POTASSIUM CHLORIDE IN NACL 20-0.45 MEQ/L-% IV SOLN
INTRAVENOUS | Status: AC
  Filled 2015-11-24: qty 1000

## 2015-11-24 MED ORDER — MORPHINE SULFATE (PF) 2 MG/ML IV SOLN
2.0000 mg | INTRAVENOUS | Status: DC | PRN
  Administered 2015-11-24 – 2015-11-25 (×5): 2 mg via INTRAVENOUS
  Filled 2015-11-24 (×5): qty 1

## 2015-11-24 MED ORDER — DEXTROSE 50 % IV SOLN
INTRAVENOUS | Status: AC
  Administered 2015-11-24: 50 mL
  Filled 2015-11-24: qty 50

## 2015-11-24 MED ORDER — DEXTROSE 50 % IV SOLN
INTRAVENOUS | Status: AC
  Filled 2015-11-24: qty 50

## 2015-11-24 NOTE — Progress Notes (Signed)
PROGRESS NOTE    Bruce French  [id number] DOB: 1925/08/27 DOA: 11/22/2015 PCP: Curlene Labrum, MD     Brief Narrative:  82 year old man admitted on 6/14 with epigastric pain. Was found to have on CT scan a perforated peptic ulcer at the gastroduodenal border. Has been seen in consultation by Dr. Arnoldo Morale and plans for conservative therapy as present.   Assessment & Plan:   Active Problems:   Perforated duodenal ulcer ([place])   Perforated ulcer of intestine ([facility])   Sepsis ([place])   Emphysema lung ([place])   Coronary artery disease   Sepsis -Due to perforated gastric/duodenal ulcer. -Sepsis parameters have improved.  Perforated gastric/duodenal ulcer without peritonitis -Continue IV Zosyn, IV Protonix, avoid nausea and retching with antiemetics. -Keep nothing by mouth, NG to suction. Surgery anticipated removing the NG tube in 1-2 days. -Has been seen in consultation by Dr. Arnoldo Morale, no emergent need for surgery at present is his opinion.  COPD/tobacco abuse -Refused nicotine patch, cessation counseling provided.  Coronary artery disease  -stable, no chest pain, aspirin and Plavix on hold due to possibility of surgery.  Complete heart block -Status post permanent pacemaker.  Acute urinary retention/BPH -Foley catheter has been placed, Avodart on hold due to hypotension. Consider initiating tomorrow.  Hypoglycemia - In the setting of patient being nothing by mouth, will start D5 half-normal saline  DVT prophylaxis: SCDs Code Status: DO NOT RESUSCITATE Family Communication: Multiple family members at bedside updated on plan of care Disposition Plan: PT consult when stable, remains nothing by mouth on NG tube  Consultants:   Surgery  Procedures:   None  Antimicrobials:   Zosyn    Subjective: Still with mild abdominal pain, no nausea.  Objective: Filed Vitals:   11/23/15 1430 11/23/15 2200 11/23/15 2227 11/24/15 0503  BP:   102/62 121/50  Pulse:   84  70  Temp:   98.2 F (36.8 C) 97.8 F (36.6 C)  TempSrc:   Oral Oral  Resp:   16 18  Height:      Weight:      SpO2: 100% 95% 95% 95%    Intake/Output Summary (Last 24 hours) at 11/24/15 1158 Last data filed at 11/23/15 1400  Gross per 24 hour  Intake    540 ml  Output      0 ml  Net    540 ml   Filed Weights   11/22/15 1527 11/22/15 2147 11/23/15 0500  Weight: 70.308 kg (155 lb) 65.3 kg (143 lb 15.4 oz) 65.3 kg (143 lb 15.4 oz)    Examination:  General exam: Alert, awake, oriented x 3 Respiratory system: Clear to auscultation. Respiratory effort normal. Cardiovascular system:RRR. No murmurs, rubs, gallops. Gastrointestinal system: Abdomen is nondistended, soft and Mild diffuse tenderness. Normal bowel sounds heard. Central nervous system: Alert and oriented. No focal neurological deficits. Extremities: No C/C/E, +pedal pulses Skin: No rashes, lesions or ulcers Psychiatry: Judgement and insight appear normal. Mood & affect appropriate.     Data Reviewed: I have personally reviewed following labs and imaging studies  CBC:  Recent Labs Lab 11/22/15 1601 11/23/15 0404 11/24/15 0732  WBC 20.3* 13.3* 12.9*  HGB 13.9 10.8* 11.8*  HCT 41.0 31.9* 36.1*  MCV 89.1 90.6 92.3  PLT 334 235 [id number]   Basic Metabolic Panel:  Recent Labs Lab 11/22/15 1601 11/23/15 0404 11/24/15 0732  NA 135 139 141  K 3.9 3.7 4.0  CL 97* 107 112*  CO2 27 25 22   GLUCOSE 181* 97 58*  BUN 50* 44* 34*  CREATININE 1.98* 1.59* 1.39*  CALCIUM 9.2 8.0* 8.1*   GFR: Estimated Creatinine Clearance: 33.9 mL/min (by C-G formula based on Cr of 1.39). Liver Function Tests:  Recent Labs Lab 11/22/15 1601  AST 28  ALT 17  ALKPHOS 69  BILITOT 0.6  PROT 7.0  ALBUMIN 3.6    Recent Labs Lab 11/22/15 1601  LIPASE 18   No results for input(s): AMMONIA in the last 168 hours. Coagulation Profile:  Recent Labs Lab 11/22/15 1650 11/22/15 2210  INR 1.23 1.28   Cardiac Enzymes:  Recent  Labs Lab 11/22/15 1650  TROPONINI <0.03   BNP (last 3 results) No results for input(s): PROBNP in the last 8760 hours. HbA1C: No results for input(s): HGBA1C in the last 72 hours. CBG:  Recent Labs Lab 11/24/15 1125  GLUCAP 56*   Lipid Profile: No results for input(s): CHOL, HDL, LDLCALC, TRIG, CHOLHDL, LDLDIRECT in the last 72 hours. Thyroid Function Tests: No results for input(s): TSH, T4TOTAL, FREET4, T3FREE, THYROIDAB in the last 72 hours. Anemia Panel: No results for input(s): VITAMINB12, FOLATE, FERRITIN, TIBC, IRON, RETICCTPCT in the last 72 hours. Urine analysis:    Component Value Date/Time   COLORURINE AMBER* 11/22/2015 [place] 11/22/2015 1724   LABSPEC >1.030* 11/22/2015 1724   PHURINE 5.5 11/22/2015 1724   GLUCOSEU NEGATIVE 11/22/2015 1724   HGBUR TRACE* 11/22/2015 1724   [place] 11/22/2015 1724   KETONESUR NEGATIVE 11/22/2015 1724   PROTEINUR 30* 11/22/2015 1724   NITRITE NEGATIVE 11/22/2015 1724   LEUKOCYTESUR NEGATIVE 11/22/2015 1724   Sepsis Labs: @LABRCNTIP (procalcitonin:4,lacticidven:4)  ) Recent Results (from the past 240 hour(s))  MRSA PCR Screening     Status: None   Collection Time: 11/22/15  9:30 PM  Result Value Ref Range Status   MRSA by PCR NEGATIVE NEGATIVE Final    Comment:        The GeneXpert MRSA Assay (FDA approved for NASAL specimens only), is one component of a comprehensive MRSA colonization surveillance program. It is not intended to diagnose MRSA infection nor to guide or monitor treatment for MRSA infections.   Culture, blood (x 2)     Status: None (Preliminary result)   Collection Time: 11/22/15 10:10 PM  Result Value Ref Range Status   Specimen Description BLOOD LEFT ARM  Final   Special Requests BOTTLES DRAWN AEROBIC AND ANAEROBIC 4CC EACH  Final   Culture NO GROWTH < 12 HOURS  Final   Report Status PENDING  Incomplete  Culture, blood (x 2)     Status: None (Preliminary result)    Collection Time: 11/22/15 10:21 PM  Result Value Ref Range Status   Specimen Description BLOOD RIGHT ARM  Final   Special Requests BOTTLES DRAWN AEROBIC AND ANAEROBIC 6CC EACH  Final   Culture NO GROWTH < 12 HOURS  Final   Report Status PENDING  Incomplete         Radiology Studies: Ct Abdomen Pelvis Wo Contrast  11/22/2015  CLINICAL DATA:  Abdominal and back pain for several days. Diarrhea with generalized weakness. Evaluate abdominal aortic aneurysm. EXAM: CT ABDOMEN AND PELVIS WITHOUT CONTRAST TECHNIQUE: Multidetector CT imaging of the abdomen and pelvis was performed following the standard protocol without IV contrast. COMPARISON:  CT 04/13/2011 and 10/10/2006 FINDINGS: Lower chest: Dependent atelectasis or scarring at both lung bases, improved from the prior examination. There is mild left lower lobe bronchiectasis. The heart is mildly enlarged. The patient has a pacemaker. There is  a small hiatal hernia with soft tissue stranding in the surrounding fat. There are also several small extraluminal air bubbles surrounding the hiatal hernia. Hepatobiliary: The liver appears unremarkable as imaged in the noncontrast state. There are small dependent gallstones. No gallbladder wall thickening or significant biliary dilatation. Pancreas: Unremarkable. No pancreatic ductal dilatation or surrounding inflammatory changes. Spleen: Normal in size without focal abnormality. Adrenals/Urinary Tract: Stable low-density bilateral adrenal adenomas. There are probable cysts in the interpolar region of the left kidney. The right kidney appears normal. No evidence of urinary tract calculus or hydronephrosis. The bladder is thick walled with bilateral diverticula, similar to the prior examination. Stomach/Bowel: As above, there is a hiatal hernia with soft tissue stranding in the surrounding fat and adjacent extraluminal air bubbles. No enteric contrast was administered. There is some high-density material within the  colonic lumen. No extraluminal contrast is demonstrated. There is irregular wall thickening of the distal stomach and duodenum with surrounding soft tissue stranding. There are extraluminal air bubbles around the liver suspicious for duodenal micro perforation. There is no focal extraluminal fluid collection. Diverticular changes are present throughout the colon, especially distally. No evidence acute inflammation. The appendix appears normal. Vascular/Lymphatic: There are no enlarged abdominal or pelvic lymph nodes. There is diffuse atherosclerosis of the aorta and its branch vessels status post interval aorto bi-iliac grafting. No evidence of recurrent aneurysm or retroperitoneal hematoma. Reproductive: Stable mild enlargement of the prostate gland. Other: No evidence of abdominal wall mass or hernia. As above, there are inflammatory changes in the right upper quadrant of the abdomen with extraluminal fluid and air bubbles suspicious for duodenal perforation. No significant ascites. Musculoskeletal: No acute or significant osseous findings. Pectus deformity of the sternum and lower lumbar spondylosis noted. IMPRESSION: 1. Right upper quadrant inflammatory process associated with wall thickening of the distal stomach and duodenum and multiple small extraluminal air bubbles, most consistent with perforation of a duodenal ulcer. No free air or focal fluid collection. 2. Interval aorto bi-iliac grafting without evidence of recurrent aneurysm or retroperitoneal hematoma. Vascular patency cannot be addressed without contrast. 3. Cholelithiasis without evidence of cholecystitis. 4. Sigmoid diverticulosis, adrenal adenomas and left renal cysts. 5. Chronic bladder wall thickening and diverticula, likely due to chronic bladder outlet obstruction. 6. These results were called by telephone at the time of interpretation on 11/22/2015 at 8:01 pm to Dr. Ezequiel Essex , who verbally acknowledged these results. Electronically  Signed   By: Richardean Sale M.D.   On: 11/22/2015 20:03   Dg Lumbar Spine 2-3 Views  11/23/2015  CLINICAL DATA:  Low back pain. EXAM: LUMBAR SPINE - 2-3 VIEW COMPARISON:  CT of the abdomen and pelvis 11/22/2015 FINDINGS: There is no evidence of lumbar spine fracture. There is mild straightening of the lumbosacral lordosis. There is minimal 5 mm posterior listhesis of L4 on L5, likely degenerative as there advanced osteoarthritic changes at this level. Multilevel osteoarthritic changes are seen at all levels of the lumbosacral spine, with more advanced changes in the lower lumbosacral spine, with disc space narrowing, endplate sclerosis, anterior osteophyte formation. There are probable disc osteophyte complexes at L4-L5 and L5-S1. Soft tissues demonstrate atherosclerotic calcifications of the aorta and common iliac arteries. IMPRESSION: Multilevel osteoarthritic changes of the lumbosacral spine, particularly advanced at L4-L5 and L5-S1 with likely degenerative grade 1 anterolisthesis of L4 on L5. Electronically Signed   By: Fidela Salisbury M.D.   On: 11/23/2015 14:14   Dg Chest Portable 1 View  11/22/2015  CLINICAL DATA:  Weakness, cough. EXAM: PORTABLE CHEST 1 VIEW COMPARISON:  Radiograph of January 05, 2013. FINDINGS: The heart size and mediastinal contours are within normal limits. Left-sided pacemaker is unchanged in position. No pneumothorax is noted. Right lung is clear. Stable left basilar scarring is noted. The visualized skeletal structures are unremarkable. IMPRESSION: No acute cardiopulmonary abnormality seen. Electronically Signed   By: Marijo Conception, M.D.   On: 11/22/2015 16:00        Scheduled Meds: . dextrose      . ipratropium-albuterol  3 mL Nebulization Q8H  . piperacillin-tazobactam (ZOSYN)  IV  3.375 g Intravenous Q8H  . sodium chloride flush  3 mL Intravenous Q12H   Continuous Infusions: . dextrose 5 % and 0.45 % NaCl with KCl 10 mEq/L    . pantoprozole (PROTONIX)  infusion 8 mg/hr (11/23/15 2137)     LOS: 2 days    Time spent: 25 minutes. Greater than 50% of this time was spent in direct contact with the patient coordinating care.     Phillips Climes, MD [facility] Pager [phone]  If 7PM-7AM, please contact night-coverage [email or web address] Password [email or web address] 11/24/2015, 11:58 AM

## 2015-11-24 NOTE — Clinical Documentation Improvement (Signed)
  Hospitalist  Would you please clarify the medical condition related to clinical findings?   Acute Renal Failure/Acute Kidney Injury  Acute Tubular Necrosis  Acute Renal Cortical Necrosis  Acute Renal Medullary Necrosis  Acute on Chronic Renal Failure  Chronic Renal Failure  Other  Clinically Undetermined  Document any associated diagnoses/conditions.   Supporting Information:  BUN 44, 34; Creatinine 1.59, 1.39; GFR 37, 44   Please exercise your independent, professional judgment when responding. A specific answer is not anticipated or expected.   Thank You,  [place] [phone]

## 2015-11-24 NOTE — Progress Notes (Signed)
Subjective: Resting comfortably. Denies any significant or worsening abdominal pain.  Objective: Vital signs in last 24 hours: Temp:  [97.8 F (36.6 C)-98.2 F (36.8 C)] 97.8 F (36.6 C) (06/16 0503) Pulse Rate:  [59-84] 70 (06/16 0503) Resp:  [12-19] 18 (06/16 0503) BP: (97-121)/(49-62) 121/50 mmHg (06/16 0503) SpO2:  [95 %-100 %] 95 % (06/16 0503) Last BM Date: 11/21/15  Intake/Output from previous day: 06/15 0701 - 06/16 0700 In: 540 [I.V.:390; IV Piggyback:150] Out: 350 [Emesis/NG output:350] Intake/Output this shift:    General appearance: alert, cooperative, appears stated age and no distress Resp: clear to auscultation bilaterally Cardio: regular rate and rhythm, S1, S2 normal, no murmur, click, rub or gallop GI: Soft, nontender, nondistended. No rigidity noted.  Lab Results:   Recent Labs  11/23/15 0404 11/24/15 0732  WBC 13.3* 12.9*  HGB 10.8* 11.8*  HCT 31.9* 36.1*  PLT 235 236   BMET  Recent Labs  11/23/15 0404 11/24/15 0732  NA 139 141  K 3.7 4.0  CL 107 112*  CO2 25 22  GLUCOSE 97 58*  BUN 44* 34*  CREATININE 1.59* 1.39*  CALCIUM 8.0* 8.1*   PT/INR  Recent Labs  11/22/15 1650 11/22/15 2210  LABPROT 15.7* 16.1*  INR 1.23 1.28    Studies/Results: Ct Abdomen Pelvis Wo Contrast  11/22/2015  CLINICAL DATA:  Abdominal and back pain for several days. Diarrhea with generalized weakness. Evaluate abdominal aortic aneurysm. EXAM: CT ABDOMEN AND PELVIS WITHOUT CONTRAST TECHNIQUE: Multidetector CT imaging of the abdomen and pelvis was performed following the standard protocol without IV contrast. COMPARISON:  CT 04/13/2011 and 10/10/2006 FINDINGS: Lower chest: Dependent atelectasis or scarring at both lung bases, improved from the prior examination. There is mild left lower lobe bronchiectasis. The heart is mildly enlarged. The patient has a pacemaker. There is a small hiatal hernia with soft tissue stranding in the surrounding fat. There are also  several small extraluminal air bubbles surrounding the hiatal hernia. Hepatobiliary: The liver appears unremarkable as imaged in the noncontrast state. There are small dependent gallstones. No gallbladder wall thickening or significant biliary dilatation. Pancreas: Unremarkable. No pancreatic ductal dilatation or surrounding inflammatory changes. Spleen: Normal in size without focal abnormality. Adrenals/Urinary Tract: Stable low-density bilateral adrenal adenomas. There are probable cysts in the interpolar region of the left kidney. The right kidney appears normal. No evidence of urinary tract calculus or hydronephrosis. The bladder is thick walled with bilateral diverticula, similar to the prior examination. Stomach/Bowel: As above, there is a hiatal hernia with soft tissue stranding in the surrounding fat and adjacent extraluminal air bubbles. No enteric contrast was administered. There is some high-density material within the colonic lumen. No extraluminal contrast is demonstrated. There is irregular wall thickening of the distal stomach and duodenum with surrounding soft tissue stranding. There are extraluminal air bubbles around the liver suspicious for duodenal micro perforation. There is no focal extraluminal fluid collection. Diverticular changes are present throughout the colon, especially distally. No evidence acute inflammation. The appendix appears normal. Vascular/Lymphatic: There are no enlarged abdominal or pelvic lymph nodes. There is diffuse atherosclerosis of the aorta and its branch vessels status post interval aorto bi-iliac grafting. No evidence of recurrent aneurysm or retroperitoneal hematoma. Reproductive: Stable mild enlargement of the prostate gland. Other: No evidence of abdominal wall mass or hernia. As above, there are inflammatory changes in the right upper quadrant of the abdomen with extraluminal fluid and air bubbles suspicious for duodenal perforation. No significant ascites.  Musculoskeletal: No acute or  significant osseous findings. Pectus deformity of the sternum and lower lumbar spondylosis noted. IMPRESSION: 1. Right upper quadrant inflammatory process associated with wall thickening of the distal stomach and duodenum and multiple small extraluminal air bubbles, most consistent with perforation of a duodenal ulcer. No free air or focal fluid collection. 2. Interval aorto bi-iliac grafting without evidence of recurrent aneurysm or retroperitoneal hematoma. Vascular patency cannot be addressed without contrast. 3. Cholelithiasis without evidence of cholecystitis. 4. Sigmoid diverticulosis, adrenal adenomas and left renal cysts. 5. Chronic bladder wall thickening and diverticula, likely due to chronic bladder outlet obstruction. 6. These results were called by telephone at the time of interpretation on 11/22/2015 at 8:01 pm to Dr. Ezequiel Essex , who verbally acknowledged these results. Electronically Signed   By: Richardean Sale M.D.   On: 11/22/2015 20:03   Dg Lumbar Spine 2-3 Views  11/23/2015  CLINICAL DATA:  Low back pain. EXAM: LUMBAR SPINE - 2-3 VIEW COMPARISON:  CT of the abdomen and pelvis 11/22/2015 FINDINGS: There is no evidence of lumbar spine fracture. There is mild straightening of the lumbosacral lordosis. There is minimal 5 mm posterior listhesis of L4 on L5, likely degenerative as there advanced osteoarthritic changes at this level. Multilevel osteoarthritic changes are seen at all levels of the lumbosacral spine, with more advanced changes in the lower lumbosacral spine, with disc space narrowing, endplate sclerosis, anterior osteophyte formation. There are probable disc osteophyte complexes at L4-L5 and L5-S1. Soft tissues demonstrate atherosclerotic calcifications of the aorta and common iliac arteries. IMPRESSION: Multilevel osteoarthritic changes of the lumbosacral spine, particularly advanced at L4-L5 and L5-S1 with likely degenerative grade 1 anterolisthesis  of L4 on L5. Electronically Signed   By: Fidela Salisbury M.D.   On: 11/23/2015 14:14   Dg Chest Portable 1 View  11/22/2015  CLINICAL DATA:  Weakness, cough. EXAM: PORTABLE CHEST 1 VIEW COMPARISON:  Radiograph of January 05, 2013. FINDINGS: The heart size and mediastinal contours are within normal limits. Left-sided pacemaker is unchanged in position. No pneumothorax is noted. Right lung is clear. Stable left basilar scarring is noted. The visualized skeletal structures are unremarkable. IMPRESSION: No acute cardiopulmonary abnormality seen. Electronically Signed   By: Marijo Conception, M.D.   On: 11/22/2015 16:00    Anti-infectives: Anti-infectives    Start     Dose/Rate Route Frequency Ordered Stop   11/22/15 2200  piperacillin-tazobactam (ZOSYN) IVPB 3.375 g  Status:  Discontinued     3.375 g 100 mL/hr over 30 Minutes Intravenous Every 8 hours 11/22/15 2128 11/22/15 2133   11/22/15 2200  piperacillin-tazobactam (ZOSYN) IVPB 3.375 g     3.375 g 12.5 mL/hr over 240 Minutes Intravenous Every 8 hours 11/22/15 2134     11/22/15 2015  piperacillin-tazobactam (ZOSYN) IVPB 3.375 g     3.375 g 12.5 mL/hr over 240 Minutes Intravenous  Once 11/22/15 2005 11/22/15 2103      Assessment/Plan: Impression: Stable, peptic ulcer disease perforation without peritonitis. Leukocytosis slightly improved. Plan: Continue NG tube decompression and IV antibiotics. Anticipate removing NG tube in next 24-48 hours.  LOS: 2 days    Bruce French A 11/24/2015

## 2015-11-25 LAB — GLUCOSE, CAPILLARY
GLUCOSE-CAPILLARY: 105 mg/dL — AB (ref 65–99)
GLUCOSE-CAPILLARY: 116 mg/dL — AB (ref 65–99)
GLUCOSE-CAPILLARY: 150 mg/dL — AB (ref 65–99)
GLUCOSE-CAPILLARY: 123 mg/dL — AB (ref 65–99)

## 2015-11-25 LAB — BASIC METABOLIC PANEL
Anion gap: 5 (ref 5–15)
BUN: 27 mg/dL — ABNORMAL HIGH (ref 6–20)
CALCIUM: 7.8 mg/dL — AB (ref 8.9–10.3)
CO2: 24 mmol/L (ref 22–32)
CREATININE: 1.23 mg/dL (ref 0.61–1.24)
Chloride: 110 mmol/L (ref 101–111)
GFR calc non Af Amer: 51 mL/min — ABNORMAL LOW (ref >60)
GFR, EST AFRICAN AMERICAN: 59 mL/min — AB (ref >60)
Glucose, Bld: 110 mg/dL — ABNORMAL HIGH (ref 65–99)
Potassium: 3.6 mmol/L (ref 3.5–5.1)
SODIUM: 139 mmol/L (ref 135–145)

## 2015-11-25 LAB — CBC
HCT: 32.6 % — ABNORMAL LOW (ref 39.0–52.0)
HEMOGLOBIN: 10.8 g/dL — AB (ref 13.0–17.0)
MCH: 30.6 pg (ref 26.0–34.0)
MCHC: 33.1 g/dL (ref 30.0–36.0)
MCV: 92.4 fL (ref 78.0–100.0)
PLATELETS: 264 10*3/uL (ref 150–400)
RBC: 3.53 MIL/uL — ABNORMAL LOW (ref 4.22–5.81)
RDW: 13.8 % (ref 11.5–15.5)
WBC: 10 10*3/uL (ref 4.0–10.5)

## 2015-11-25 LAB — PHOSPHORUS: PHOSPHORUS: 2.7 mg/dL (ref 2.5–4.6)

## 2015-11-25 MED ORDER — BISACODYL 10 MG RE SUPP
10.0000 mg | Freq: Every morning | RECTAL | Status: DC
  Administered 2015-11-25: 10 mg via RECTAL
  Filled 2015-11-25 (×2): qty 1

## 2015-11-25 MED ORDER — DUTASTERIDE 0.5 MG PO CAPS
0.5000 mg | ORAL_CAPSULE | Freq: Every day | ORAL | Status: DC
  Administered 2015-11-25 – 2015-11-27 (×3): 0.5 mg via ORAL
  Filled 2015-11-25 (×5): qty 1

## 2015-11-25 MED ORDER — ALUM & MAG HYDROXIDE-SIMETH 200-200-20 MG/5ML PO SUSP: 30.0000 mL | ORAL | Status: DC | PRN

## 2015-11-25 MED ORDER — SIMETHICONE 80 MG PO CHEW: 80.0000 mg | CHEWABLE_TABLET | Freq: Four times a day (QID) | ORAL | Status: DC | PRN

## 2015-11-25 MED ORDER — FAMOTIDINE IN NACL 20-0.9 MG/50ML-% IV SOLN
20.0000 mg | Freq: Two times a day (BID) | INTRAVENOUS | Status: DC
Start: 2015-11-25 — End: 2015-11-25
  Filled 2015-11-25 (×6): qty 50

## 2015-11-25 MED ORDER — IPRATROPIUM-ALBUTEROL 0.5-2.5 (3) MG/3ML IN SOLN
3.0000 mL | Freq: Two times a day (BID) | RESPIRATORY_TRACT | Status: DC
  Administered 2015-11-25 – 2015-11-26 (×3): 3 mL via RESPIRATORY_TRACT
  Filled 2015-11-25 (×3): qty 3

## 2015-11-25 MED ORDER — LIDOCAINE VISCOUS 2 % MT SOLN
15.0000 mL | OROMUCOSAL | Status: DC | PRN
  Filled 2015-11-25: qty 15

## 2015-11-25 NOTE — Progress Notes (Signed)
PROGRESS NOTE    Bruce French  [id number] DOB: [birth year] DOA: 11/22/2015 PCP: Curlene Labrum, MD     Brief Narrative:  82 year old man admitted on 6/14 with epigastric pain. Was found to have on CT scan a perforated peptic ulcer at the gastroduodenal border. Has been seen in consultation by Dr. Arnoldo Morale and plans for conservative therapy as present, NGT is out, on clear liquid diet.   Assessment & Plan:   Active Problems:   Perforated duodenal ulcer (Home)   Perforated ulcer of intestine ([facility])   Sepsis ([place])   Emphysema lung ([place])   Coronary artery disease   Sepsis -Due to perforated gastric/duodenal ulcer. -Sepsis parameters have improved.  Perforated gastric/duodenal ulcer without peritonitis -Continue IV Zosyn, IV Protonix, avoid nausea and retching with antiemetics. -Has been seen in consultation by Dr. Arnoldo Morale, no emergent need for surgery at present is his opinion. - Continue with conservative management, NG tube discontinued today, will start on clear liquid diet per surgical recommendation, abdominal pain significantly improved.  COPD/tobacco abuse -Refused nicotine patch, cessation counseling provided.  Coronary artery disease  -stable, no chest pain, aspirin and Plavix on hold due to possibility of surgery.  Complete heart block -Status post permanent pacemaker.  Acute urinary retention/BPH -Foley catheter has been placed, will resume Avodart today, consider voiding trial in a.m.  Hypoglycemia - In the setting of patient being nothing by mouth, continue with D5 half-normal saline today.  DVT prophylaxis: SCDs Code Status: DO NOT RESUSCITATE Family Communication: None at bedside Disposition Plan: PT consult when stable.  Consultants:   Surgery  Procedures:   None  Antimicrobials:   Zosyn    Subjective: Still with mild abdominal pain, no nausea.  Objective: Filed Vitals:   11/24/15 2311 11/24/15 2315 11/25/15 0712 11/25/15 0744    BP: 140/49   117/69  Pulse: 60   56  Temp: 97.8 F (36.6 C)   97.9 F (36.6 C)  TempSrc: Oral   Oral  Resp: 18   16  Height:      Weight:      SpO2: 95% 94% 92% 93%    Intake/Output Summary (Last 24 hours) at 11/25/15 1254 Last data filed at 11/24/15 1533  Gross per 24 hour  Intake  697.5 ml  Output    100 ml  Net  597.5 ml   Filed Weights   11/22/15 1527 11/22/15 2147 11/23/15 0500  Weight: 70.308 kg (155 lb) 65.3 kg (143 lb 15.4 oz) 65.3 kg (143 lb 15.4 oz)    Examination:  General exam: Alert, awake, oriented x 3 Respiratory system: Clear to auscultation. Respiratory effort normal. Cardiovascular system:RRR. No murmurs, rubs, gallops. Gastrointestinal system: Abdomen is nondistended, soft and Nontender. Normal bowel sounds heard. Central nervous system: Alert and oriented. No focal neurological deficits. Extremities: No C/C/E, +pedal pulses Skin: No rashes, lesions or ulcers Psychiatry: Judgement and insight appear normal. Mood & affect appropriate.     Data Reviewed: I have personally reviewed following labs and imaging studies  CBC:  Recent Labs Lab 11/22/15 1601 11/23/15 0404 11/24/15 0732 11/25/15 0529  WBC 20.3* 13.3* 12.9* 10.0  HGB 13.9 10.8* 11.8* 10.8*  HCT 41.0 31.9* 36.1* 32.6*  MCV 89.1 90.6 92.3 92.4  PLT 334 235 236 [id number]   Basic Metabolic Panel:  Recent Labs Lab 11/22/15 1601 11/23/15 0404 11/24/15 0732 11/25/15 0529  NA 135 139 141 139  K 3.9 3.7 4.0 3.6  CL 97* 107 112* 110  CO2 27 25 22  24  GLUCOSE 181* 97 58* 110*  BUN 50* 44* 34* 27*  CREATININE 1.98* 1.59* 1.39* 1.23  CALCIUM 9.2 8.0* 8.1* 7.8*  PHOS  --   --   --  2.7   GFR: Estimated Creatinine Clearance: 38.3 mL/min (by C-G formula based on Cr of 1.23). Liver Function Tests:  Recent Labs Lab 11/22/15 1601  AST 28  ALT 17  ALKPHOS 69  BILITOT 0.6  PROT 7.0  ALBUMIN 3.6    Recent Labs Lab 11/22/15 1601  LIPASE 18   No results for input(s): AMMONIA in  the last 168 hours. Coagulation Profile:  Recent Labs Lab 11/22/15 1650 11/22/15 2210  INR 1.23 1.28   Cardiac Enzymes:  Recent Labs Lab 11/22/15 1650  TROPONINI <0.03   BNP (last 3 results) No results for input(s): PROBNP in the last 8760 hours. HbA1C: No results for input(s): HGBA1C in the last 72 hours. CBG:  Recent Labs Lab 11/24/15 1946 11/24/15 2329 11/25/15 0344 11/25/15 0753 11/25/15 1208  GLUCAP 86 79 105* 116* 150*   Lipid Profile: No results for input(s): CHOL, HDL, LDLCALC, TRIG, CHOLHDL, LDLDIRECT in the last 72 hours. Thyroid Function Tests: No results for input(s): TSH, T4TOTAL, FREET4, T3FREE, THYROIDAB in the last 72 hours. Anemia Panel: No results for input(s): VITAMINB12, FOLATE, FERRITIN, TIBC, IRON, RETICCTPCT in the last 72 hours. Urine analysis:    Component Value Date/Time   COLORURINE AMBER* 11/22/2015 [place] 11/22/2015 1724   LABSPEC >1.030* 11/22/2015 1724   PHURINE 5.5 11/22/2015 1724   GLUCOSEU NEGATIVE 11/22/2015 1724   HGBUR TRACE* 11/22/2015 1724   [place] 11/22/2015 1724   KETONESUR NEGATIVE 11/22/2015 1724   PROTEINUR 30* 11/22/2015 1724   NITRITE NEGATIVE 11/22/2015 1724   LEUKOCYTESUR NEGATIVE 11/22/2015 1724   Sepsis Labs: @LABRCNTIP (procalcitonin:4,lacticidven:4)  ) Recent Results (from the past 240 hour(s))  MRSA PCR Screening     Status: None   Collection Time: 11/22/15  9:30 PM  Result Value Ref Range Status   MRSA by PCR NEGATIVE NEGATIVE Final    Comment:        The GeneXpert MRSA Assay (FDA approved for NASAL specimens only), is one component of a comprehensive MRSA colonization surveillance program. It is not intended to diagnose MRSA infection nor to guide or monitor treatment for MRSA infections.   Culture, blood (x 2)     Status: None (Preliminary result)   Collection Time: 11/22/15 10:10 PM  Result Value Ref Range Status   Specimen Description BLOOD LEFT ARM   Final   Special Requests BOTTLES DRAWN AEROBIC AND ANAEROBIC 4CC EACH  Final   Culture NO GROWTH 3 DAYS  Final   Report Status PENDING  Incomplete  Culture, blood (x 2)     Status: None (Preliminary result)   Collection Time: 11/22/15 10:21 PM  Result Value Ref Range Status   Specimen Description BLOOD RIGHT ARM  Final   Special Requests BOTTLES DRAWN AEROBIC AND ANAEROBIC 6CC EACH  Final   Culture NO GROWTH 3 DAYS  Final   Report Status PENDING  Incomplete         Radiology Studies: Dg Lumbar Spine 2-3 Views  11/23/2015  CLINICAL DATA:  Low back pain. EXAM: LUMBAR SPINE - 2-3 VIEW COMPARISON:  CT of the abdomen and pelvis 11/22/2015 FINDINGS: There is no evidence of lumbar spine fracture. There is mild straightening of the lumbosacral lordosis. There is minimal 5 mm posterior listhesis of L4 on L5, likely degenerative  as there advanced osteoarthritic changes at this level. Multilevel osteoarthritic changes are seen at all levels of the lumbosacral spine, with more advanced changes in the lower lumbosacral spine, with disc space narrowing, endplate sclerosis, anterior osteophyte formation. There are probable disc osteophyte complexes at L4-L5 and L5-S1. Soft tissues demonstrate atherosclerotic calcifications of the aorta and common iliac arteries. IMPRESSION: Multilevel osteoarthritic changes of the lumbosacral spine, particularly advanced at L4-L5 and L5-S1 with likely degenerative grade 1 anterolisthesis of L4 on L5. Electronically Signed   By: Fidela Salisbury M.D.   On: 11/23/2015 14:14   Dg Abd 1 View  11/24/2015  CLINICAL DATA:  NG tube placement. EXAM: ABDOMEN - 1 VIEW COMPARISON:  Lumbar spine 11/23/2015 FINDINGS: Enteric tube tip is in the left upper quadrant consistent with location in the body of the stomach. Visualized bowel gas pattern is normal with scattered gas and stool in the colon. No small or large bowel distention. Surgical clips in the mid abdomen. Vascular  calcifications. Degenerative changes in the spine. IMPRESSION: Enteric tube tip is in the left upper quadrant consistent with location in the body of the stomach. Electronically Signed   By: Lucienne Capers M.D.   On: 11/24/2015 23:42        Scheduled Meds: . bisacodyl  10 mg Rectal q morning - 10a  . ipratropium-albuterol  3 mL Nebulization BID  . piperacillin-tazobactam (ZOSYN)  IV  3.375 g Intravenous Q8H  . sodium chloride flush  3 mL Intravenous Q12H   Continuous Infusions: . dextrose 5 % and 0.45 % NaCl with KCl 10 mEq/L 75 mL/hr at 11/25/15 0547  . pantoprozole (PROTONIX) infusion 8 mg/hr (11/25/15 0547)     LOS: 3 days    Time spent: 25 minutes. Greater than 50% of this time was spent in direct contact with the patient coordinating care.     Phillips Climes, MD [facility] Pager [phone]  If 7PM-7AM, please contact night-coverage [email or web address] Password [facility] 11/25/2015, 12:54 PM

## 2015-11-25 NOTE — Progress Notes (Signed)
Subjective: NG tube output has dropped off. Patient had some abdominal pain yesterday, but currently has no abdominal pain.  Objective: Vital signs in last 24 hours: Temp:  [97.5 F (36.4 C)-97.9 F (36.6 C)] 97.9 F (36.6 C) (06/17 0744) Pulse Rate:  [56-61] 56 (06/17 0744) Resp:  [16-18] 16 (06/17 0744) BP: (117-140)/(46-69) 117/69 mmHg (06/17 0744) SpO2:  [92 %-99 %] 93 % (06/17 0744) Last BM Date: 11/21/15  Intake/Output from previous day: 06/16 0701 - 06/17 0700 In: 697.5 [I.V.:697.5] Out: 100 [Emesis/NG output:100] Intake/Output this shift:    General appearance: alert, cooperative and no distress Resp: clear to auscultation bilaterally Cardio: regular rate and rhythm, S1, S2 normal, no murmur, click, rub or gallop GI: soft, non-tender; bowel sounds normal; no masses,  no organomegaly  Lab Results:   Recent Labs  11/24/15 0732 11/25/15 0529  WBC 12.9* 10.0  HGB 11.8* 10.8*  HCT 36.1* 32.6*  PLT 236 264   BMET  Recent Labs  11/24/15 0732 11/25/15 0529  NA 141 139  K 4.0 3.6  CL 112* 110  CO2 22 24  GLUCOSE 58* 110*  BUN 34* 27*  CREATININE 1.39* 1.23  CALCIUM 8.1* 7.8*   PT/INR  Recent Labs  11/22/15 1650 11/22/15 2210  LABPROT 15.7* 16.1*  INR 1.23 1.28    Studies/Results: Dg Lumbar Spine 2-3 Views  11/23/2015  CLINICAL DATA:  Low back pain. EXAM: LUMBAR SPINE - 2-3 VIEW COMPARISON:  CT of the abdomen and pelvis 11/22/2015 FINDINGS: There is no evidence of lumbar spine fracture. There is mild straightening of the lumbosacral lordosis. There is minimal 5 mm posterior listhesis of L4 on L5, likely degenerative as there advanced osteoarthritic changes at this level. Multilevel osteoarthritic changes are seen at all levels of the lumbosacral spine, with more advanced changes in the lower lumbosacral spine, with disc space narrowing, endplate sclerosis, anterior osteophyte formation. There are probable disc osteophyte complexes at L4-L5 and L5-S1.  Soft tissues demonstrate atherosclerotic calcifications of the aorta and common iliac arteries. IMPRESSION: Multilevel osteoarthritic changes of the lumbosacral spine, particularly advanced at L4-L5 and L5-S1 with likely degenerative grade 1 anterolisthesis of L4 on L5. Electronically Signed   By: Fidela Salisbury M.D.   On: 11/23/2015 14:14   Dg Abd 1 View  11/24/2015  CLINICAL DATA:  NG tube placement. EXAM: ABDOMEN - 1 VIEW COMPARISON:  Lumbar spine 11/23/2015 FINDINGS: Enteric tube tip is in the left upper quadrant consistent with location in the body of the stomach. Visualized bowel gas pattern is normal with scattered gas and stool in the colon. No small or large bowel distention. Surgical clips in the mid abdomen. Vascular calcifications. Degenerative changes in the spine. IMPRESSION: Enteric tube tip is in the left upper quadrant consistent with location in the body of the stomach. Electronically Signed   By: Lucienne Capers M.D.   On: 11/24/2015 23:42    Anti-infectives: Anti-infectives    Start     Dose/Rate Route Frequency Ordered Stop   11/22/15 2200  piperacillin-tazobactam (ZOSYN) IVPB 3.375 g  Status:  Discontinued     3.375 g 100 mL/hr over 30 Minutes Intravenous Every 8 hours 11/22/15 2128 11/22/15 2133   11/22/15 2200  piperacillin-tazobactam (ZOSYN) IVPB 3.375 g     3.375 g 12.5 mL/hr over 240 Minutes Intravenous Every 8 hours 11/22/15 2134     11/22/15 2015  piperacillin-tazobactam (ZOSYN) IVPB 3.375 g     3.375 g 12.5 mL/hr over 240 Minutes Intravenous  Once  11/22/15 2005 11/22/15 2103      Assessment/Plan: Impression: Progressing well, no evidence of peritonitis. NG tube output has decreased. Plan: Will DC NG tube. Start full liquid diet. Patient needs to have head of bed elevated. Will give suppository as French cathartic.  LOS: 3 days    Bruce French 11/25/2015

## 2015-11-26 LAB — GLUCOSE, CAPILLARY
GLUCOSE-CAPILLARY: 117 mg/dL — AB (ref 65–99)
GLUCOSE-CAPILLARY: 139 mg/dL — AB (ref 65–99)
GLUCOSE-CAPILLARY: 160 mg/dL — AB (ref 65–99)
Glucose-Capillary: 119 mg/dL — ABNORMAL HIGH (ref 65–99)
Glucose-Capillary: 120 mg/dL — ABNORMAL HIGH (ref 65–99)
Glucose-Capillary: 161 mg/dL — ABNORMAL HIGH (ref 65–99)

## 2015-11-26 LAB — CBC
HEMATOCRIT: 31.2 % — AB (ref 39.0–52.0)
HEMOGLOBIN: 10.5 g/dL — AB (ref 13.0–17.0)
MCH: 30.7 pg (ref 26.0–34.0)
MCHC: 33.7 g/dL (ref 30.0–36.0)
MCV: 91.2 fL (ref 78.0–100.0)
Platelets: 264 10*3/uL (ref 150–400)
RBC: 3.42 MIL/uL — AB (ref 4.22–5.81)
RDW: 13.7 % (ref 11.5–15.5)
WBC: 10.3 10*3/uL (ref 4.0–10.5)

## 2015-11-26 LAB — BASIC METABOLIC PANEL
Anion gap: 6 (ref 5–15)
BUN: 17 mg/dL (ref 6–20)
CHLORIDE: 110 mmol/L (ref 101–111)
CO2: 23 mmol/L (ref 22–32)
CREATININE: 1.1 mg/dL (ref 0.61–1.24)
Calcium: 7.8 mg/dL — ABNORMAL LOW (ref 8.9–10.3)
GFR calc Af Amer: >60 mL/min (ref >60)
GFR calc non Af Amer: 58 mL/min — ABNORMAL LOW (ref >60)
Glucose, Bld: 114 mg/dL — ABNORMAL HIGH (ref 65–99)
POTASSIUM: 3.4 mmol/L — AB (ref 3.5–5.1)
SODIUM: 139 mmol/L (ref 135–145)

## 2015-11-26 MED ORDER — HYDROCODONE-ACETAMINOPHEN 5-325 MG PO TABS
1.0000 | ORAL_TABLET | Freq: Four times a day (QID) | ORAL | Status: DC | PRN
  Filled 2015-11-26: qty 1

## 2015-11-26 MED ORDER — PANTOPRAZOLE SODIUM 40 MG PO TBEC
40.0000 mg | DELAYED_RELEASE_TABLET | Freq: Two times a day (BID) | ORAL | Status: DC
  Administered 2015-11-26 – 2015-11-27 (×3): 40 mg via ORAL
  Filled 2015-11-26 (×3): qty 1

## 2015-11-26 MED ORDER — SODIUM CHLORIDE 0.9 % IV BOLUS (SEPSIS)
500.0000 mL | Freq: Once | INTRAVENOUS | Status: AC
  Administered 2015-11-26: 500 mL via INTRAVENOUS

## 2015-11-26 NOTE — Progress Notes (Signed)
Patient had loose medium size BM this AM.  Will hold suppository at this time, had large formed one yesterday as well.

## 2015-11-26 NOTE — Progress Notes (Signed)
  Subjective: Patient denies any significant abdominal pain today. Tolerating full liquid diet well. Has had multiple bowel movements.  Objective: Vital signs in last 24 hours: Temp:  [98.3 F (36.8 C)-98.9 F (37.2 C)] 98.6 F (37 C) (06/18 0640) Pulse Rate:  [61-83] 68 (06/18 0640) Resp:  [20] 20 (06/18 0640) BP: (113-137)/(42-54) 137/54 mmHg (06/18 0640) SpO2:  [93 %-97 %] 94 % (06/18 0734) Last BM Date: 11/26/15  Intake/Output from previous day:   Intake/Output this shift:    General appearance: alert, cooperative, appears stated age and no distress GI: soft, non-tender; bowel sounds normal; no masses,  no organomegaly  Lab Results:   Recent Labs  11/25/15 0529 11/26/15 0511  WBC 10.0 10.3  HGB 10.8* 10.5*  HCT 32.6* 31.2*  PLT 264 264   BMET  Recent Labs  11/25/15 0529 11/26/15 0511  NA 139 139  K 3.6 3.4*  CL 110 110  CO2 24 23  GLUCOSE 110* 114*  BUN 27* 17  CREATININE 1.23 1.10  CALCIUM 7.8* 7.8*   PT/INR No results for input(s): LABPROT, INR in the last 72 hours.  Studies/Results: Dg Abd 1 View  11/24/2015  CLINICAL DATA:  NG tube placement. EXAM: ABDOMEN - 1 VIEW COMPARISON:  Lumbar spine 11/23/2015 FINDINGS: Enteric tube tip is in the left upper quadrant consistent with location in the body of the stomach. Visualized bowel gas pattern is normal with scattered gas and stool in the colon. No small or large bowel distention. Surgical clips in the mid abdomen. Vascular calcifications. Degenerative changes in the spine. IMPRESSION: Enteric tube tip is in the left upper quadrant consistent with location in the body of the stomach. Electronically Signed   By: Lucienne Capers M.D.   On: 11/24/2015 23:42    Anti-infectives: Anti-infectives    Start     Dose/Rate Route Frequency Ordered Stop   11/22/15 2200  piperacillin-tazobactam (ZOSYN) IVPB 3.375 g  Status:  Discontinued     3.375 g 100 mL/hr over 30 Minutes Intravenous Every 8 hours 11/22/15 2128  11/22/15 2133   11/22/15 2200  piperacillin-tazobactam (ZOSYN) IVPB 3.375 g     3.375 g 12.5 mL/hr over 240 Minutes Intravenous Every 8 hours 11/22/15 2134     11/22/15 2015  piperacillin-tazobactam (ZOSYN) IVPB 3.375 g     3.375 g 12.5 mL/hr over 240 Minutes Intravenous  Once 11/22/15 2005 11/22/15 2103      Assessment/Plan: Impression: Continues to heal well. No evidence of peritonitis. Plan: We'll advance to soft diet. Will switch to by mouth Protonix. Anticipate discharge in next 24-48 hours.  LOS: 4 days    Jasdeep Kepner A 11/26/2015

## 2015-11-26 NOTE — Progress Notes (Signed)
PROGRESS NOTE    Bruce French  [id number] DOB: Mar 26, 1926 DOA: 11/22/2015 PCP: Curlene Labrum, MD     Brief Narrative:  82 year old man admitted on 6/14 with epigastric pain. Was found to have on CT scan a perforated peptic ulcer at the gastroduodenal border. Has been seen in consultation by Dr. Arnoldo Morale and plans for conservative therapy as present. NG tube is out and he has been advanced to a soft diet today.   Assessment & Plan:   Active Problems:   Perforated duodenal ulcer ([place])   Perforated ulcer of intestine ([facility])   Sepsis ([place])   Emphysema lung ([place])   Coronary artery disease   Sepsis -Due to perforated gastric/duodenal ulcer. -Sepsis parameters have improved.  Perforated gastric/duodenal ulcer -Continue IV Zosyn, change Protonix to oral, avoid retching with antiemetics. -NG tube has been discontinued, has been started on a soft diet today. -Home for for discharge home for the next 24-48 hours. -Has been seen in consultation by Dr. Arnoldo Morale, no emergent need for surgery at present is his opinion.  COPD/tobacco abuse -Refused nicotine patch, cessation counseling provided.  Coronary artery disease  -stable, no chest pain, aspirin and Plavix on hold due to possibility of surgery.  Complete heart block -Status post permanent pacemaker.  Acute urinary retention/BPH -Foley catheter has been placed, Avodart on hold due to hypotension. Consider initiating tomorrow.   DVT prophylaxis: SCDs Code Status: DO NOT RESUSCITATE Family Communication: Multiple family members at bedside updated on plan of care Disposition Plan: For discharge from the next 24-48 hours  Consultants:   Surgery  Procedures:   None  Antimicrobials:   Zosyn    Subjective: Feels much improved, anxious to go home.  Objective: Filed Vitals:   11/25/15 2026 11/26/15 0640 11/26/15 0734 11/26/15 1343  BP: 113/42 137/54  131/45  Pulse: 83 68  60  Temp: 98.3 F (36.8 C) 98.6  F (37 C)  97.8 F (36.6 C)  TempSrc: Oral Oral  Oral  Resp: 20 20  20   Height:      Weight:      SpO2: 95% 97% 94% 100%   No intake or output data in the 24 hours ending 11/26/15 1612 Filed Weights   11/22/15 1527 11/22/15 2147 11/23/15 0500  Weight: 70.308 kg (155 lb) 65.3 kg (143 lb 15.4 oz) 65.3 kg (143 lb 15.4 oz)    Examination:  General exam: Alert, awake, oriented x 3 Respiratory system: Clear to auscultation. Respiratory effort normal. Cardiovascular system:RRR. No murmurs, rubs, gallops. Gastrointestinal system: Abdomen is nondistended, soft and nontender. No organomegaly or masses felt. Normal bowel sounds heard. Central nervous system: Alert and oriented. No focal neurological deficits. Extremities: No C/C/E, +pedal pulses Skin: No rashes, lesions or ulcers Psychiatry: Judgement and insight appear normal. Mood & affect appropriate.     Data Reviewed: I have personally reviewed following labs and imaging studies  CBC:  Recent Labs Lab 11/22/15 1601 11/23/15 0404 11/24/15 0732 11/25/15 0529 11/26/15 0511  WBC 20.3* 13.3* 12.9* 10.0 10.3  HGB 13.9 10.8* 11.8* 10.8* 10.5*  HCT 41.0 31.9* 36.1* 32.6* 31.2*  MCV 89.1 90.6 92.3 92.4 91.2  PLT 334 235 236 264 [id number]   Basic Metabolic Panel:  Recent Labs Lab 11/22/15 1601 11/23/15 0404 11/24/15 0732 11/25/15 0529 11/26/15 0511  NA 135 139 141 139 139  K 3.9 3.7 4.0 3.6 3.4*  CL 97* 107 112* 110 110  CO2 27 25 22 24 23   GLUCOSE 181* 97 58*  110* 114*  BUN 50* 44* 34* 27* 17  CREATININE 1.98* 1.59* 1.39* 1.23 1.10  CALCIUM 9.2 8.0* 8.1* 7.8* 7.8*  PHOS  --   --   --  2.7  --    GFR: Estimated Creatinine Clearance: 42.9 mL/min (by C-G formula based on Cr of 1.1). Liver Function Tests:  Recent Labs Lab 11/22/15 1601  AST 28  ALT 17  ALKPHOS 69  BILITOT 0.6  PROT 7.0  ALBUMIN 3.6    Recent Labs Lab 11/22/15 1601  LIPASE 18   No results for input(s): AMMONIA in the last 168  hours. Coagulation Profile:  Recent Labs Lab 11/22/15 1650 11/22/15 2210  INR 1.23 1.28   Cardiac Enzymes:  Recent Labs Lab 11/22/15 1650  TROPONINI <0.03   BNP (last 3 results) No results for input(s): PROBNP in the last 8760 hours. HbA1C: No results for input(s): HGBA1C in the last 72 hours. CBG:  Recent Labs Lab 11/25/15 1655 11/25/15 2025 11/26/15 0022 11/26/15 0737 11/26/15 1153  GLUCAP 123* 160* 139* 119* 117*   Lipid Profile: No results for input(s): CHOL, HDL, LDLCALC, TRIG, CHOLHDL, LDLDIRECT in the last 72 hours. Thyroid Function Tests: No results for input(s): TSH, T4TOTAL, FREET4, T3FREE, THYROIDAB in the last 72 hours. Anemia Panel: No results for input(s): VITAMINB12, FOLATE, FERRITIN, TIBC, IRON, RETICCTPCT in the last 72 hours. Urine analysis:    Component Value Date/Time   COLORURINE AMBER* 11/22/2015 [place] 11/22/2015 1724   LABSPEC >1.030* 11/22/2015 1724   PHURINE 5.5 11/22/2015 1724   GLUCOSEU NEGATIVE 11/22/2015 1724   HGBUR TRACE* 11/22/2015 1724   [place] 11/22/2015 1724   KETONESUR NEGATIVE 11/22/2015 1724   PROTEINUR 30* 11/22/2015 1724   NITRITE NEGATIVE 11/22/2015 1724   LEUKOCYTESUR NEGATIVE 11/22/2015 1724   Sepsis Labs: @LABRCNTIP (procalcitonin:4,lacticidven:4)  ) Recent Results (from the past 240 hour(s))  MRSA PCR Screening     Status: None   Collection Time: 11/22/15  9:30 PM  Result Value Ref Range Status   MRSA by PCR NEGATIVE NEGATIVE Final    Comment:        The GeneXpert MRSA Assay (FDA approved for NASAL specimens only), is one component of a comprehensive MRSA colonization surveillance program. It is not intended to diagnose MRSA infection nor to guide or monitor treatment for MRSA infections.   Culture, blood (x 2)     Status: None (Preliminary result)   Collection Time: 11/22/15 10:10 PM  Result Value Ref Range Status   Specimen Description BLOOD LEFT ARM  Final    Special Requests BOTTLES DRAWN AEROBIC AND ANAEROBIC 4CC EACH  Final   Culture NO GROWTH 4 DAYS  Final   Report Status PENDING  Incomplete  Culture, blood (x 2)     Status: None (Preliminary result)   Collection Time: 11/22/15 10:21 PM  Result Value Ref Range Status   Specimen Description BLOOD RIGHT ARM  Final   Special Requests BOTTLES DRAWN AEROBIC AND ANAEROBIC 6CC EACH  Final   Culture NO GROWTH 4 DAYS  Final   Report Status PENDING  Incomplete         Radiology Studies: Dg Abd 1 View  11/24/2015  CLINICAL DATA:  NG tube placement. EXAM: ABDOMEN - 1 VIEW COMPARISON:  Lumbar spine 11/23/2015 FINDINGS: Enteric tube tip is in the left upper quadrant consistent with location in the body of the stomach. Visualized bowel gas pattern is normal with scattered gas and stool in the colon. No small  or large bowel distention. Surgical clips in the mid abdomen. Vascular calcifications. Degenerative changes in the spine. IMPRESSION: Enteric tube tip is in the left upper quadrant consistent with location in the body of the stomach. Electronically Signed   By: Lucienne Capers M.D.   On: 11/24/2015 23:42        Scheduled Meds: . dutasteride  0.5 mg Oral Daily  . ipratropium-albuterol  3 mL Nebulization BID  . pantoprazole  40 mg Oral BID  . piperacillin-tazobactam (ZOSYN)  IV  3.375 g Intravenous Q8H  . sodium chloride flush  3 mL Intravenous Q12H   Continuous Infusions: . dextrose 5 % and 0.45 % NaCl with KCl 10 mEq/L 10 mL/hr at 11/26/15 1133     LOS: 4 days    Time spent: 25 minutes. Greater than 50% of this time was spent in direct contact with the patient coordinating care.     Lelon Frohlich, MD [facility] Pager [phone]  If 7PM-7AM, please contact night-coverage [email or web address] Password [facility] 11/26/2015, 4:12 PM

## 2015-11-27 LAB — CULTURE, BLOOD (ROUTINE X 2)
Culture: NO GROWTH
Culture: NO GROWTH

## 2015-11-27 LAB — GLUCOSE, CAPILLARY
GLUCOSE-CAPILLARY: 103 mg/dL — AB (ref 65–99)
GLUCOSE-CAPILLARY: 105 mg/dL — AB (ref 65–99)
GLUCOSE-CAPILLARY: 143 mg/dL — AB (ref 65–99)
GLUCOSE-CAPILLARY: 91 mg/dL (ref 65–99)

## 2015-11-27 MED ORDER — PANTOPRAZOLE SODIUM 40 MG PO TBEC: 40.0000 mg | DELAYED_RELEASE_TABLET | Freq: Two times a day (BID) | ORAL | Status: DC

## 2015-11-27 MED ORDER — CIPROFLOXACIN HCL 500 MG PO TABS: 500.0000 mg | ORAL_TABLET | Freq: Two times a day (BID) | ORAL | Status: DC

## 2015-11-27 MED ORDER — LEVALBUTEROL HCL 0.63 MG/3ML IN NEBU: 0.6300 mg | INHALATION_SOLUTION | Freq: Three times a day (TID) | RESPIRATORY_TRACT | Status: DC | PRN

## 2015-11-27 NOTE — Care Management Note (Signed)
Case Management Note  Patient Details  Name: TELLER BALGOBIN MRN: [id number] Date of Birth: [birth year]  Subjective/Objective:                  Pt admitted with a perforated ulcer. Pt is from home, lives with his daughter and is ind with ADL's. Daughter is at pt's bedside. Pt is ind with ADL's. Pt uses no assistive devices with mobility. Pt has no HH services PTA. Pt's daughter takes him to appointments and pt recently was Rx expensive medications but pt will no longer be taking these. Pt plans to return home with self care.   Action/Plan: Anticipate DC home today. No CM needs.   Expected Discharge Date:    11/27/2015              Expected Discharge Plan:  Home/Self Care  In-House Referral:  NA  Discharge planning Services  CM Consult  Post Acute Care Choice:  NA Choice offered to:  NA  DME Arranged:    DME Agency:     HH Arranged:    HH Agency:     Status of Service:  Completed, signed off  Medicare Important Message Given:  Yes Date Medicare IM Given:    Medicare IM give by:    Date Additional Medicare IM Given:    Additional Medicare Important Message give by:     If discussed at [place] of Stay Meetings, dates discussed:    Additional Comments:  Sherald Barge, RN 11/27/2015, 11:06 AM

## 2015-11-27 NOTE — Discharge Summary (Signed)
Physician Discharge Summary  YAQOOB KAZI [id number] DOB: 1926/01/04 DOA: 11/22/2015  PCP: Curlene Labrum, MD  Admit date: 11/22/2015 Discharge date: 11/27/2015  Time spent: 45 minutes  Recommendations for Outpatient Follow-up:  -Will be discharged home today. -Advised to follow-up with primary care provider in 2 weeks.   Discharge Diagnoses:  Active Problems:   Perforated duodenal ulcer ([place])   Perforated ulcer of intestine ([place])   Sepsis ([place])   Emphysema lung ([place])   Coronary artery disease   Discharge Condition: Stable and improved  Filed Weights   11/22/15 1527 11/22/15 2147 11/23/15 0500  Weight: 70.308 kg (155 lb) 65.3 kg (143 lb 15.4 oz) 65.3 kg (143 lb 15.4 oz)    History of present illness:  As per Dr. Carles Collet on 6/14: Bruce French is a 82 y.o. male with medical history of COPD, cognitive impairment, hypertension, complete heart block status post PPM presented with 4 day history of worsening abdominal pain and chronic lower back pain. The patient states that he normally takes hydrocodone for his low back pain, but this was discontinued this week, and the patient was placed on a prednisone taper of which he started taking on 11/21/2015. The patient denies any NSAIDs or other over-the-counter remedies. He denied any recent injuries or falls. At baseline, the patient uses a cane to ambulate. He has been having melanotic stools for the past week, but denies any hematochezia or hematemesis. Patient denies fevers, chills, headache, chest pain, dyspnea. Patient had one episode of nausea and vomiting yesterday without any blood. Last bowel movement was 11/21/2015.  In the Emergency department, patient was afebrile with initially some soft blood pressures 89/60 which improved with fluid resuscitation. The patient received 3 L normal saline with improvement of 110/62. Serum creatinine was 1.98. WBC was 20.3. Lactic acid was 3.98. Chest x-ray showed left basilar scar. CT  abdomen and pelvis revealed small extraluminal air bubble surrounding hiatus hernia. There was also irregular thickened wall in the distal stomach and on with soft tissue stranding. There was extraluminal air noted in the area around the liver. His aortobiiliac graft was without any aneurysm or hematoma. LFTs and lipase are normal.  Hospital Course:   Sepsis -Due to perforated gastric/duodenal ulcer. -Sepsis parameters have improved.  Perforated gastric/duodenal ulcer -Continue Protonix twice daily. -Cipro for 2 more weeks per surgical recommendations. -NG tube has been discontinued, is tolerating a solid diet. -Has been seen in consultation by Dr. Arnoldo Morale, no emergent need for surgery at present is his opinion.  COPD/tobacco abuse -Refused nicotine patch, cessation counseling provided.  Coronary artery disease  -stable, no chest pain,. -Aspirin has been placed on indefinite hold due to perforated peptic ulcer.  Complete heart block -Status post permanent pacemaker.  Acute urinary retention/BPH -Resolved, Foley has been discontinued, urinating without issues.  Procedures:  None   Consultations:  Surgery, Dr. Arnoldo Morale  Discharge Instructions  Discharge Instructions    Diet - low sodium heart healthy    Complete by:  As directed      Increase activity slowly    Complete by:  As directed             Medication List    STOP taking these medications        aspirin EC 81 MG tablet     clopidogrel 75 MG tablet  Commonly known as:  PLAVIX     predniSONE 10 MG tablet  Commonly known as:  DELTASONE  TAKE these medications        atorvastatin 20 MG tablet  Commonly known as:  LIPITOR  Take 20 mg by mouth daily.     ciprofloxacin 500 MG tablet  Commonly known as:  CIPRO  Take 1 tablet (500 mg total) by mouth 2 (two) times daily.     donepezil 10 MG tablet  Commonly known as:  ARICEPT  Take 10 mg by mouth daily.     DULoxetine 30 MG capsule  Commonly  known as:  CYMBALTA  Take 30 mg by mouth daily.     dutasteride 0.5 MG capsule  Commonly known as:  AVODART  Take 0.5 mg by mouth daily.     multivitamin with minerals Tabs tablet  Take 1 tablet by mouth daily.     pantoprazole 40 MG tablet  Commonly known as:  PROTONIX  Take 1 tablet (40 mg total) by mouth 2 (two) times daily.     valsartan-hydrochlorothiazide 160-12.5 MG tablet  Commonly known as:  DIOVAN-HCT  Take 1 tablet by mouth daily.     vitamin C 1000 MG tablet  Take 1,000 mg by mouth daily.     Vitamin D-3 5000 UNITS Tabs  Take 5,000 Units by mouth daily.       Allergies  Allergen Reactions  . Xanax Xr [Alprazolam Er] Other (See Comments)    "he was out for 4 days."       Follow-up Information    Follow up with Curlene Labrum, MD On 12/04/2015.   Specialty:  Family Medicine   Why:  11:00   Contact information:   [place] [address] [phone]        The results of significant diagnostics from this hospitalization (including imaging, microbiology, ancillary and laboratory) are listed below for reference.    Significant Diagnostic Studies: Ct Abdomen Pelvis Wo Contrast  11/22/2015  CLINICAL DATA:  Abdominal and back pain for several days. Diarrhea with generalized weakness. Evaluate abdominal aortic aneurysm. EXAM: CT ABDOMEN AND PELVIS WITHOUT CONTRAST TECHNIQUE: Multidetector CT imaging of the abdomen and pelvis was performed following the standard protocol without IV contrast. COMPARISON:  CT 04/13/2011 and 10/10/2006 FINDINGS: Lower chest: Dependent atelectasis or scarring at both lung bases, improved from the prior examination. There is mild left lower lobe bronchiectasis. The heart is mildly enlarged. The patient has a pacemaker. There is a small hiatal hernia with soft tissue stranding in the surrounding fat. There are also several small extraluminal air bubbles surrounding the hiatal hernia. Hepatobiliary: The liver appears unremarkable  as imaged in the noncontrast state. There are small dependent gallstones. No gallbladder wall thickening or significant biliary dilatation. Pancreas: Unremarkable. No pancreatic ductal dilatation or surrounding inflammatory changes. Spleen: Normal in size without focal abnormality. Adrenals/Urinary Tract: Stable low-density bilateral adrenal adenomas. There are probable cysts in the interpolar region of the left kidney. The right kidney appears normal. No evidence of urinary tract calculus or hydronephrosis. The bladder is thick walled with bilateral diverticula, similar to the prior examination. Stomach/Bowel: As above, there is a hiatal hernia with soft tissue stranding in the surrounding fat and adjacent extraluminal air bubbles. No enteric contrast was administered. There is some high-density material within the colonic lumen. No extraluminal contrast is demonstrated. There is irregular wall thickening of the distal stomach and duodenum with surrounding soft tissue stranding. There are extraluminal air bubbles around the liver suspicious for duodenal micro perforation. There is no focal extraluminal fluid collection. Diverticular changes are  present throughout the colon, especially distally. No evidence acute inflammation. The appendix appears normal. Vascular/Lymphatic: There are no enlarged abdominal or pelvic lymph nodes. There is diffuse atherosclerosis of the aorta and its branch vessels status post interval aorto bi-iliac grafting. No evidence of recurrent aneurysm or retroperitoneal hematoma. Reproductive: Stable mild enlargement of the prostate gland. Other: No evidence of abdominal wall mass or hernia. As above, there are inflammatory changes in the right upper quadrant of the abdomen with extraluminal fluid and air bubbles suspicious for duodenal perforation. No significant ascites. Musculoskeletal: No acute or significant osseous findings. Pectus deformity of the sternum and lower lumbar spondylosis  noted. IMPRESSION: 1. Right upper quadrant inflammatory process associated with wall thickening of the distal stomach and duodenum and multiple small extraluminal air bubbles, most consistent with perforation of a duodenal ulcer. No free air or focal fluid collection. 2. Interval aorto bi-iliac grafting without evidence of recurrent aneurysm or retroperitoneal hematoma. Vascular patency cannot be addressed without contrast. 3. Cholelithiasis without evidence of cholecystitis. 4. Sigmoid diverticulosis, adrenal adenomas and left renal cysts. 5. Chronic bladder wall thickening and diverticula, likely due to chronic bladder outlet obstruction. 6. These results were called by telephone at the time of interpretation on 11/22/2015 at 8:01 pm to Dr. Ezequiel Essex , who verbally acknowledged these results. Electronically Signed   By: Richardean Sale M.D.   On: 11/22/2015 20:03   Dg Lumbar Spine 2-3 Views  11/23/2015  CLINICAL DATA:  Low back pain. EXAM: LUMBAR SPINE - 2-3 VIEW COMPARISON:  CT of the abdomen and pelvis 11/22/2015 FINDINGS: There is no evidence of lumbar spine fracture. There is mild straightening of the lumbosacral lordosis. There is minimal 5 mm posterior listhesis of L4 on L5, likely degenerative as there advanced osteoarthritic changes at this level. Multilevel osteoarthritic changes are seen at all levels of the lumbosacral spine, with more advanced changes in the lower lumbosacral spine, with disc space narrowing, endplate sclerosis, anterior osteophyte formation. There are probable disc osteophyte complexes at L4-L5 and L5-S1. Soft tissues demonstrate atherosclerotic calcifications of the aorta and common iliac arteries. IMPRESSION: Multilevel osteoarthritic changes of the lumbosacral spine, particularly advanced at L4-L5 and L5-S1 with likely degenerative grade 1 anterolisthesis of L4 on L5. Electronically Signed   By: Fidela Salisbury M.D.   On: 11/23/2015 14:14   Dg Abd 1 View  11/24/2015   CLINICAL DATA:  NG tube placement. EXAM: ABDOMEN - 1 VIEW COMPARISON:  Lumbar spine 11/23/2015 FINDINGS: Enteric tube tip is in the left upper quadrant consistent with location in the body of the stomach. Visualized bowel gas pattern is normal with scattered gas and stool in the colon. No small or large bowel distention. Surgical clips in the mid abdomen. Vascular calcifications. Degenerative changes in the spine. IMPRESSION: Enteric tube tip is in the left upper quadrant consistent with location in the body of the stomach. Electronically Signed   By: Lucienne Capers M.D.   On: 11/24/2015 23:42   Dg Chest Portable 1 View  11/22/2015  CLINICAL DATA:  Weakness, cough. EXAM: PORTABLE CHEST 1 VIEW COMPARISON:  Radiograph of January 05, 2013. FINDINGS: The heart size and mediastinal contours are within normal limits. Left-sided pacemaker is unchanged in position. No pneumothorax is noted. Right lung is clear. Stable left basilar scarring is noted. The visualized skeletal structures are unremarkable. IMPRESSION: No acute cardiopulmonary abnormality seen. Electronically Signed   By: Marijo Conception, M.D.   On: 11/22/2015 16:00    Microbiology: Recent Results (  from the past 240 hour(s))  MRSA PCR Screening     Status: None   Collection Time: 11/22/15  9:30 PM  Result Value Ref Range Status   MRSA by PCR NEGATIVE NEGATIVE Final    Comment:        The GeneXpert MRSA Assay (FDA approved for NASAL specimens only), is one component of a comprehensive MRSA colonization surveillance program. It is not intended to diagnose MRSA infection nor to guide or monitor treatment for MRSA infections.   Culture, blood (x 2)     Status: None   Collection Time: 11/22/15 10:10 PM  Result Value Ref Range Status   Specimen Description BLOOD LEFT ARM  Final   Special Requests BOTTLES DRAWN AEROBIC AND ANAEROBIC 4CC EACH  Final   Culture NO GROWTH 5 DAYS  Final   Report Status 11/27/2015 FINAL  Final  Culture, blood (x  2)     Status: None   Collection Time: 11/22/15 10:21 PM  Result Value Ref Range Status   Specimen Description BLOOD RIGHT ARM  Final   Special Requests BOTTLES DRAWN AEROBIC AND ANAEROBIC [facility] EACH  Final   Culture NO GROWTH 5 DAYS  Final   Report Status 11/27/2015 FINAL  Final     Labs: Basic Metabolic Panel:  Recent Labs Lab 11/22/15 1601 11/23/15 0404 11/24/15 0732 11/25/15 0529 11/26/15 0511  NA 135 139 141 139 139  K 3.9 3.7 4.0 3.6 3.4*  CL 97* 107 112* 110 110  CO2 27 25 22 24 23   GLUCOSE 181* 97 58* 110* 114*  BUN 50* 44* 34* 27* 17  CREATININE 1.98* 1.59* 1.39* 1.23 1.10  CALCIUM 9.2 8.0* 8.1* 7.8* 7.8*  PHOS  --   --   --  2.7  --    Liver Function Tests:  Recent Labs Lab 11/22/15 1601  AST 28  ALT 17  ALKPHOS 69  BILITOT 0.6  PROT 7.0  ALBUMIN 3.6    Recent Labs Lab 11/22/15 1601  LIPASE 18   No results for input(s): AMMONIA in the last 168 hours. CBC:  Recent Labs Lab 11/22/15 1601 11/23/15 0404 11/24/15 0732 11/25/15 0529 11/26/15 0511  WBC 20.3* 13.3* 12.9* 10.0 10.3  HGB 13.9 10.8* 11.8* 10.8* 10.5*  HCT 41.0 31.9* 36.1* 32.6* 31.2*  MCV 89.1 90.6 92.3 92.4 91.2  PLT 334 235 236 264 264   Cardiac Enzymes:  Recent Labs Lab 11/22/15 1650  TROPONINI <0.03   BNP: BNP (last 3 results) No results for input(s): BNP in the last 8760 hours.  ProBNP (last 3 results) No results for input(s): PROBNP in the last 8760 hours.  CBG:  Recent Labs Lab 11/26/15 2055 11/27/15 0013 11/27/15 0618 11/27/15 0727 11/27/15 1107  GLUCAP 161* 143* 105* 91 103*       Signed:  HERNANDEZ ACOSTA,ESTELA  [facility] Pager: [phone] 11/27/2015, 5:11 PM

## 2015-11-27 NOTE — Progress Notes (Signed)
  Subjective: Patient tolerating regular diet well. No complaints of abdominal pain.  Objective: Vital signs in last 24 hours: Temp:  [97.8 F (36.6 C)-97.9 F (36.6 C)] 97.9 F (36.6 C) (06/19 0615) Pulse Rate:  [60-84] 67 (06/19 0615) Resp:  [16-20] 20 (06/19 0615) BP: (110-131)/(38-46) 126/42 mmHg (06/19 0615) SpO2:  [96 %-100 %] 97 % (06/19 0615) Last BM Date: 11/26/15  Intake/Output from previous day: 06/18 0701 - 06/19 0700 In: -  Out: 675 [Urine:675] Intake/Output this shift: Total I/O In: -  Out: 200 [Urine:200]  General appearance: alert, cooperative, appears stated age and no distress Resp: clear to auscultation bilaterally Cardio: regular rate and rhythm, S1, S2 normal, no murmur, click, rub or gallop GI: soft, non-tender; bowel sounds normal; no masses,  no organomegaly  Lab Results:   Recent Labs  11/25/15 0529 11/26/15 0511  WBC 10.0 10.3  HGB 10.8* 10.5*  HCT 32.6* 31.2*  PLT 264 264   BMET  Recent Labs  11/25/15 0529 11/26/15 0511  NA 139 139  K 3.6 3.4*  CL 110 110  CO2 24 23  GLUCOSE 110* 114*  BUN 27* 17  CREATININE 1.23 1.10  CALCIUM 7.8* 7.8*   PT/INR No results for input(s): LABPROT, INR in the last 72 hours.  Studies/Results: No results found.  Anti-infectives: Anti-infectives    Start     Dose/Rate Route Frequency Ordered Stop   11/22/15 2200  piperacillin-tazobactam (ZOSYN) IVPB 3.375 g  Status:  Discontinued     3.375 g 100 mL/hr over 30 Minutes Intravenous Every 8 hours 11/22/15 2128 11/22/15 2133   11/22/15 2200  piperacillin-tazobactam (ZOSYN) IVPB 3.375 g     3.375 g 12.5 mL/hr over 240 Minutes Intravenous Every 8 hours 11/22/15 2134     11/22/15 2015  piperacillin-tazobactam (ZOSYN) IVPB 3.375 g     3.375 g 12.5 mL/hr over 240 Minutes Intravenous  Once 11/22/15 2005 11/22/15 2103      Assessment/Plan: Impression: Healed perforated peptic ulcer. Plan: Patient okay for discharge from surgery standpoint. We'll  continue oral antibiotic for 1 week. Needs to be on PPI twice a day for 3 months. May follow-up with primary care physician.  LOS: 5 days    Bruce French A 11/27/2015

## 2015-11-27 NOTE — Care Management Important Message (Signed)
Important Message  Patient Details  Name: Bruce French MRN: [id number] Date of Birth: 17-Mar-1926   Medicare Important Message Given:  Yes    Sherald Barge, RN 11/27/2015, 11:05 AM

## 2015-11-27 NOTE — Progress Notes (Signed)
Discharge instructions read to family and patient.  Both verbalized understanding of instructions. Discharged to home with family 

## 2015-12-04 DIAGNOSIS — N183 Chronic kidney disease, stage 3 (moderate): Secondary | ICD-10-CM | POA: Diagnosis not present

## 2015-12-04 DIAGNOSIS — J439 Emphysema, unspecified: Secondary | ICD-10-CM | POA: Diagnosis not present

## 2015-12-04 DIAGNOSIS — I6529 Occlusion and stenosis of unspecified carotid artery: Secondary | ICD-10-CM | POA: Diagnosis not present

## 2015-12-04 DIAGNOSIS — I714 Abdominal aortic aneurysm, without rupture: Secondary | ICD-10-CM | POA: Diagnosis not present

## 2015-12-04 DIAGNOSIS — F17210 Nicotine dependence, cigarettes, uncomplicated: Secondary | ICD-10-CM | POA: Diagnosis not present

## 2015-12-04 DIAGNOSIS — E782 Mixed hyperlipidemia: Secondary | ICD-10-CM | POA: Diagnosis not present

## 2015-12-04 DIAGNOSIS — I10 Essential (primary) hypertension: Secondary | ICD-10-CM | POA: Diagnosis not present

## 2015-12-04 DIAGNOSIS — K219 Gastro-esophageal reflux disease without esophagitis: Secondary | ICD-10-CM | POA: Diagnosis not present

## 2015-12-04 DIAGNOSIS — E1165 Type 2 diabetes mellitus with hyperglycemia: Secondary | ICD-10-CM | POA: Diagnosis not present

## 2015-12-04 DIAGNOSIS — E861 Hypovolemia: Secondary | ICD-10-CM | POA: Diagnosis not present

## 2015-12-04 DIAGNOSIS — K262 Acute duodenal ulcer with both hemorrhage and perforation: Secondary | ICD-10-CM | POA: Diagnosis not present

## 2015-12-18 DIAGNOSIS — E1165 Type 2 diabetes mellitus with hyperglycemia: Secondary | ICD-10-CM | POA: Diagnosis not present

## 2015-12-18 DIAGNOSIS — I6522 Occlusion and stenosis of left carotid artery: Secondary | ICD-10-CM | POA: Diagnosis not present

## 2015-12-18 DIAGNOSIS — Z95828 Presence of other vascular implants and grafts: Secondary | ICD-10-CM | POA: Diagnosis not present

## 2015-12-18 DIAGNOSIS — Z950 Presence of cardiac pacemaker: Secondary | ICD-10-CM | POA: Diagnosis not present

## 2015-12-18 DIAGNOSIS — E782 Mixed hyperlipidemia: Secondary | ICD-10-CM | POA: Diagnosis not present

## 2015-12-18 DIAGNOSIS — I714 Abdominal aortic aneurysm, without rupture: Secondary | ICD-10-CM | POA: Diagnosis not present

## 2015-12-18 DIAGNOSIS — I10 Essential (primary) hypertension: Secondary | ICD-10-CM | POA: Diagnosis not present

## 2015-12-18 DIAGNOSIS — I639 Cerebral infarction, unspecified: Secondary | ICD-10-CM | POA: Diagnosis not present

## 2015-12-27 DIAGNOSIS — E1165 Type 2 diabetes mellitus with hyperglycemia: Secondary | ICD-10-CM | POA: Diagnosis not present

## 2015-12-27 DIAGNOSIS — K262 Acute duodenal ulcer with both hemorrhage and perforation: Secondary | ICD-10-CM | POA: Diagnosis not present

## 2015-12-27 DIAGNOSIS — I6529 Occlusion and stenosis of unspecified carotid artery: Secondary | ICD-10-CM | POA: Diagnosis not present

## 2015-12-27 DIAGNOSIS — E782 Mixed hyperlipidemia: Secondary | ICD-10-CM | POA: Diagnosis not present

## 2015-12-27 DIAGNOSIS — E861 Hypovolemia: Secondary | ICD-10-CM | POA: Diagnosis not present

## 2015-12-27 DIAGNOSIS — I10 Essential (primary) hypertension: Secondary | ICD-10-CM | POA: Diagnosis not present

## 2015-12-27 DIAGNOSIS — I714 Abdominal aortic aneurysm, without rupture: Secondary | ICD-10-CM | POA: Diagnosis not present

## 2015-12-27 DIAGNOSIS — F17210 Nicotine dependence, cigarettes, uncomplicated: Secondary | ICD-10-CM | POA: Diagnosis not present

## 2016-02-05 ENCOUNTER — Other Ambulatory Visit: Payer: Self-pay

## 2016-04-08 DIAGNOSIS — N183 Chronic kidney disease, stage 3 (moderate): Secondary | ICD-10-CM | POA: Diagnosis not present

## 2016-04-08 DIAGNOSIS — K219 Gastro-esophageal reflux disease without esophagitis: Secondary | ICD-10-CM | POA: Diagnosis not present

## 2016-04-08 DIAGNOSIS — E1165 Type 2 diabetes mellitus with hyperglycemia: Secondary | ICD-10-CM | POA: Diagnosis not present

## 2016-04-08 DIAGNOSIS — E782 Mixed hyperlipidemia: Secondary | ICD-10-CM | POA: Diagnosis not present

## 2016-04-08 DIAGNOSIS — I10 Essential (primary) hypertension: Secondary | ICD-10-CM | POA: Diagnosis not present

## 2016-04-08 DIAGNOSIS — F17210 Nicotine dependence, cigarettes, uncomplicated: Secondary | ICD-10-CM | POA: Diagnosis not present

## 2016-04-08 DIAGNOSIS — E861 Hypovolemia: Secondary | ICD-10-CM | POA: Diagnosis not present

## 2016-04-11 DIAGNOSIS — Z950 Presence of cardiac pacemaker: Secondary | ICD-10-CM | POA: Diagnosis not present

## 2016-04-11 DIAGNOSIS — R2681 Unsteadiness on feet: Secondary | ICD-10-CM | POA: Diagnosis not present

## 2016-04-11 DIAGNOSIS — E782 Mixed hyperlipidemia: Secondary | ICD-10-CM | POA: Diagnosis not present

## 2016-04-11 DIAGNOSIS — Z23 Encounter for immunization: Secondary | ICD-10-CM | POA: Diagnosis not present

## 2016-04-11 DIAGNOSIS — J439 Emphysema, unspecified: Secondary | ICD-10-CM | POA: Diagnosis not present

## 2016-04-11 DIAGNOSIS — Z95828 Presence of other vascular implants and grafts: Secondary | ICD-10-CM | POA: Diagnosis not present

## 2016-04-11 DIAGNOSIS — E1165 Type 2 diabetes mellitus with hyperglycemia: Secondary | ICD-10-CM | POA: Diagnosis not present

## 2016-04-11 DIAGNOSIS — I10 Essential (primary) hypertension: Secondary | ICD-10-CM | POA: Diagnosis not present

## 2016-04-16 DIAGNOSIS — I6523 Occlusion and stenosis of bilateral carotid arteries: Secondary | ICD-10-CM | POA: Diagnosis not present

## 2016-04-16 DIAGNOSIS — I272 Pulmonary hypertension, unspecified: Secondary | ICD-10-CM | POA: Diagnosis not present

## 2016-04-16 DIAGNOSIS — I517 Cardiomegaly: Secondary | ICD-10-CM | POA: Diagnosis not present

## 2016-04-16 DIAGNOSIS — I351 Nonrheumatic aortic (valve) insufficiency: Secondary | ICD-10-CM | POA: Diagnosis not present

## 2016-04-16 DIAGNOSIS — I5030 Unspecified diastolic (congestive) heart failure: Secondary | ICD-10-CM | POA: Diagnosis not present

## 2016-04-29 DIAGNOSIS — I714 Abdominal aortic aneurysm, without rupture: Secondary | ICD-10-CM | POA: Diagnosis not present

## 2016-04-29 DIAGNOSIS — N281 Cyst of kidney, acquired: Secondary | ICD-10-CM | POA: Diagnosis not present

## 2016-04-29 DIAGNOSIS — K828 Other specified diseases of gallbladder: Secondary | ICD-10-CM | POA: Diagnosis not present

## 2016-09-09 DIAGNOSIS — E1165 Type 2 diabetes mellitus with hyperglycemia: Secondary | ICD-10-CM | POA: Diagnosis not present

## 2016-09-09 DIAGNOSIS — K219 Gastro-esophageal reflux disease without esophagitis: Secondary | ICD-10-CM | POA: Diagnosis not present

## 2016-09-09 DIAGNOSIS — N183 Chronic kidney disease, stage 3 (moderate): Secondary | ICD-10-CM | POA: Diagnosis not present

## 2016-09-09 DIAGNOSIS — E782 Mixed hyperlipidemia: Secondary | ICD-10-CM | POA: Diagnosis not present

## 2016-09-09 DIAGNOSIS — I10 Essential (primary) hypertension: Secondary | ICD-10-CM | POA: Diagnosis not present

## 2016-09-11 DIAGNOSIS — I10 Essential (primary) hypertension: Secondary | ICD-10-CM | POA: Diagnosis not present

## 2016-09-11 DIAGNOSIS — M4317 Spondylolisthesis, lumbosacral region: Secondary | ICD-10-CM | POA: Diagnosis not present

## 2016-09-11 DIAGNOSIS — I639 Cerebral infarction, unspecified: Secondary | ICD-10-CM | POA: Diagnosis not present

## 2016-09-11 DIAGNOSIS — E782 Mixed hyperlipidemia: Secondary | ICD-10-CM | POA: Diagnosis not present

## 2016-09-11 DIAGNOSIS — I6522 Occlusion and stenosis of left carotid artery: Secondary | ICD-10-CM | POA: Diagnosis not present

## 2016-09-11 DIAGNOSIS — E1165 Type 2 diabetes mellitus with hyperglycemia: Secondary | ICD-10-CM | POA: Diagnosis not present

## 2016-09-11 DIAGNOSIS — I714 Abdominal aortic aneurysm, without rupture: Secondary | ICD-10-CM | POA: Diagnosis not present

## 2016-09-11 DIAGNOSIS — Z1389 Encounter for screening for other disorder: Secondary | ICD-10-CM | POA: Diagnosis not present

## 2016-09-26 DIAGNOSIS — C44622 Squamous cell carcinoma of skin of right upper limb, including shoulder: Secondary | ICD-10-CM | POA: Diagnosis not present

## 2016-12-05 DIAGNOSIS — E861 Hypovolemia: Secondary | ICD-10-CM | POA: Diagnosis not present

## 2016-12-05 DIAGNOSIS — I10 Essential (primary) hypertension: Secondary | ICD-10-CM | POA: Diagnosis not present

## 2016-12-05 DIAGNOSIS — K219 Gastro-esophageal reflux disease without esophagitis: Secondary | ICD-10-CM | POA: Diagnosis not present

## 2016-12-05 DIAGNOSIS — E1165 Type 2 diabetes mellitus with hyperglycemia: Secondary | ICD-10-CM | POA: Diagnosis not present

## 2016-12-05 DIAGNOSIS — E782 Mixed hyperlipidemia: Secondary | ICD-10-CM | POA: Diagnosis not present

## 2016-12-10 DIAGNOSIS — I6522 Occlusion and stenosis of left carotid artery: Secondary | ICD-10-CM | POA: Diagnosis not present

## 2016-12-10 DIAGNOSIS — I10 Essential (primary) hypertension: Secondary | ICD-10-CM | POA: Diagnosis not present

## 2016-12-10 DIAGNOSIS — Z6820 Body mass index (BMI) 20.0-20.9, adult: Secondary | ICD-10-CM | POA: Diagnosis not present

## 2016-12-10 DIAGNOSIS — I714 Abdominal aortic aneurysm, without rupture: Secondary | ICD-10-CM | POA: Diagnosis not present

## 2016-12-10 DIAGNOSIS — E1165 Type 2 diabetes mellitus with hyperglycemia: Secondary | ICD-10-CM | POA: Diagnosis not present

## 2016-12-10 DIAGNOSIS — F17210 Nicotine dependence, cigarettes, uncomplicated: Secondary | ICD-10-CM | POA: Diagnosis not present

## 2016-12-10 DIAGNOSIS — C4492 Squamous cell carcinoma of skin, unspecified: Secondary | ICD-10-CM | POA: Diagnosis not present

## 2016-12-10 DIAGNOSIS — E782 Mixed hyperlipidemia: Secondary | ICD-10-CM | POA: Diagnosis not present

## 2016-12-13 DIAGNOSIS — R05 Cough: Secondary | ICD-10-CM | POA: Diagnosis not present

## 2016-12-13 DIAGNOSIS — I700 Atherosclerosis of aorta: Secondary | ICD-10-CM | POA: Diagnosis not present

## 2016-12-13 DIAGNOSIS — D3502 Benign neoplasm of left adrenal gland: Secondary | ICD-10-CM | POA: Diagnosis not present

## 2016-12-13 DIAGNOSIS — K5790 Diverticulosis of intestine, part unspecified, without perforation or abscess without bleeding: Secondary | ICD-10-CM | POA: Diagnosis not present

## 2016-12-13 DIAGNOSIS — J439 Emphysema, unspecified: Secondary | ICD-10-CM | POA: Diagnosis not present

## 2016-12-13 DIAGNOSIS — K5730 Diverticulosis of large intestine without perforation or abscess without bleeding: Secondary | ICD-10-CM | POA: Diagnosis not present

## 2016-12-13 DIAGNOSIS — R634 Abnormal weight loss: Secondary | ICD-10-CM | POA: Diagnosis not present

## 2016-12-13 DIAGNOSIS — D3501 Benign neoplasm of right adrenal gland: Secondary | ICD-10-CM | POA: Diagnosis not present

## 2016-12-13 DIAGNOSIS — R918 Other nonspecific abnormal finding of lung field: Secondary | ICD-10-CM | POA: Diagnosis not present

## 2016-12-13 DIAGNOSIS — R0602 Shortness of breath: Secondary | ICD-10-CM | POA: Diagnosis not present

## 2017-01-01 DIAGNOSIS — R2681 Unsteadiness on feet: Secondary | ICD-10-CM | POA: Diagnosis not present

## 2017-01-01 DIAGNOSIS — C4492 Squamous cell carcinoma of skin, unspecified: Secondary | ICD-10-CM | POA: Diagnosis not present

## 2017-01-01 DIAGNOSIS — M545 Low back pain: Secondary | ICD-10-CM | POA: Diagnosis not present

## 2017-01-01 DIAGNOSIS — N183 Chronic kidney disease, stage 3 (moderate): Secondary | ICD-10-CM | POA: Diagnosis not present

## 2017-01-01 DIAGNOSIS — Z6820 Body mass index (BMI) 20.0-20.9, adult: Secondary | ICD-10-CM | POA: Diagnosis not present

## 2017-01-01 DIAGNOSIS — M1991 Primary osteoarthritis, unspecified site: Secondary | ICD-10-CM | POA: Diagnosis not present

## 2017-01-01 DIAGNOSIS — M4317 Spondylolisthesis, lumbosacral region: Secondary | ICD-10-CM | POA: Diagnosis not present

## 2017-01-27 DIAGNOSIS — C44622 Squamous cell carcinoma of skin of right upper limb, including shoulder: Secondary | ICD-10-CM | POA: Diagnosis not present

## 2017-06-06 DIAGNOSIS — M545 Low back pain: Secondary | ICD-10-CM | POA: Diagnosis not present

## 2017-06-06 DIAGNOSIS — E782 Mixed hyperlipidemia: Secondary | ICD-10-CM | POA: Diagnosis not present

## 2017-06-06 DIAGNOSIS — E440 Moderate protein-calorie malnutrition: Secondary | ICD-10-CM | POA: Diagnosis not present

## 2017-06-06 DIAGNOSIS — F17210 Nicotine dependence, cigarettes, uncomplicated: Secondary | ICD-10-CM | POA: Diagnosis not present

## 2017-06-06 DIAGNOSIS — I714 Abdominal aortic aneurysm, without rupture: Secondary | ICD-10-CM | POA: Diagnosis not present

## 2017-06-06 DIAGNOSIS — I10 Essential (primary) hypertension: Secondary | ICD-10-CM | POA: Diagnosis not present

## 2017-06-06 DIAGNOSIS — Z6820 Body mass index (BMI) 20.0-20.9, adult: Secondary | ICD-10-CM | POA: Diagnosis not present

## 2017-06-06 DIAGNOSIS — J439 Emphysema, unspecified: Secondary | ICD-10-CM | POA: Diagnosis not present

## 2017-06-06 DIAGNOSIS — I6522 Occlusion and stenosis of left carotid artery: Secondary | ICD-10-CM | POA: Diagnosis not present

## 2017-06-06 DIAGNOSIS — E1165 Type 2 diabetes mellitus with hyperglycemia: Secondary | ICD-10-CM | POA: Diagnosis not present

## 2017-06-06 DIAGNOSIS — N183 Chronic kidney disease, stage 3 (moderate): Secondary | ICD-10-CM | POA: Diagnosis not present

## 2017-06-06 DIAGNOSIS — N400 Enlarged prostate without lower urinary tract symptoms: Secondary | ICD-10-CM | POA: Diagnosis not present

## 2017-06-12 DIAGNOSIS — J439 Emphysema, unspecified: Secondary | ICD-10-CM | POA: Diagnosis not present

## 2017-06-12 DIAGNOSIS — R918 Other nonspecific abnormal finding of lung field: Secondary | ICD-10-CM | POA: Diagnosis not present

## 2017-06-12 DIAGNOSIS — I700 Atherosclerosis of aorta: Secondary | ICD-10-CM | POA: Diagnosis not present

## 2017-06-12 DIAGNOSIS — R911 Solitary pulmonary nodule: Secondary | ICD-10-CM | POA: Diagnosis not present

## 2017-06-12 DIAGNOSIS — I712 Thoracic aortic aneurysm, without rupture: Secondary | ICD-10-CM | POA: Diagnosis not present

## 2017-09-02 DIAGNOSIS — I10 Essential (primary) hypertension: Secondary | ICD-10-CM | POA: Diagnosis not present

## 2017-09-02 DIAGNOSIS — E782 Mixed hyperlipidemia: Secondary | ICD-10-CM | POA: Diagnosis not present

## 2017-09-02 DIAGNOSIS — Z79899 Other long term (current) drug therapy: Secondary | ICD-10-CM | POA: Diagnosis not present

## 2017-09-02 DIAGNOSIS — E1165 Type 2 diabetes mellitus with hyperglycemia: Secondary | ICD-10-CM | POA: Diagnosis not present

## 2017-09-02 DIAGNOSIS — N183 Chronic kidney disease, stage 3 (moderate): Secondary | ICD-10-CM | POA: Diagnosis not present

## 2017-09-10 DIAGNOSIS — Z6820 Body mass index (BMI) 20.0-20.9, adult: Secondary | ICD-10-CM | POA: Diagnosis not present

## 2017-09-10 DIAGNOSIS — N183 Chronic kidney disease, stage 3 (moderate): Secondary | ICD-10-CM | POA: Diagnosis not present

## 2017-09-10 DIAGNOSIS — I714 Abdominal aortic aneurysm, without rupture: Secondary | ICD-10-CM | POA: Diagnosis not present

## 2017-09-10 DIAGNOSIS — E440 Moderate protein-calorie malnutrition: Secondary | ICD-10-CM | POA: Diagnosis not present

## 2017-09-10 DIAGNOSIS — M5416 Radiculopathy, lumbar region: Secondary | ICD-10-CM | POA: Diagnosis not present

## 2017-09-10 DIAGNOSIS — Z0001 Encounter for general adult medical examination with abnormal findings: Secondary | ICD-10-CM | POA: Diagnosis not present

## 2017-09-10 DIAGNOSIS — E782 Mixed hyperlipidemia: Secondary | ICD-10-CM | POA: Diagnosis not present

## 2017-09-10 DIAGNOSIS — E1165 Type 2 diabetes mellitus with hyperglycemia: Secondary | ICD-10-CM | POA: Diagnosis not present

## 2017-09-10 DIAGNOSIS — N400 Enlarged prostate without lower urinary tract symptoms: Secondary | ICD-10-CM | POA: Diagnosis not present

## 2017-09-10 DIAGNOSIS — I10 Essential (primary) hypertension: Secondary | ICD-10-CM | POA: Diagnosis not present

## 2017-09-10 DIAGNOSIS — J439 Emphysema, unspecified: Secondary | ICD-10-CM | POA: Diagnosis not present

## 2017-09-10 DIAGNOSIS — I6522 Occlusion and stenosis of left carotid artery: Secondary | ICD-10-CM | POA: Diagnosis not present

## 2017-09-10 DIAGNOSIS — M545 Low back pain: Secondary | ICD-10-CM | POA: Diagnosis not present

## 2017-09-10 DIAGNOSIS — F17210 Nicotine dependence, cigarettes, uncomplicated: Secondary | ICD-10-CM | POA: Diagnosis not present

## 2017-12-15 DIAGNOSIS — E1122 Type 2 diabetes mellitus with diabetic chronic kidney disease: Secondary | ICD-10-CM | POA: Diagnosis not present

## 2017-12-18 DIAGNOSIS — E1165 Type 2 diabetes mellitus with hyperglycemia: Secondary | ICD-10-CM | POA: Diagnosis not present

## 2017-12-18 DIAGNOSIS — Z6820 Body mass index (BMI) 20.0-20.9, adult: Secondary | ICD-10-CM | POA: Diagnosis not present

## 2017-12-18 DIAGNOSIS — I10 Essential (primary) hypertension: Secondary | ICD-10-CM | POA: Diagnosis not present

## 2017-12-18 DIAGNOSIS — I6522 Occlusion and stenosis of left carotid artery: Secondary | ICD-10-CM | POA: Diagnosis not present

## 2017-12-18 DIAGNOSIS — H6123 Impacted cerumen, bilateral: Secondary | ICD-10-CM | POA: Diagnosis not present

## 2017-12-18 DIAGNOSIS — E782 Mixed hyperlipidemia: Secondary | ICD-10-CM | POA: Diagnosis not present

## 2017-12-18 DIAGNOSIS — F17210 Nicotine dependence, cigarettes, uncomplicated: Secondary | ICD-10-CM | POA: Diagnosis not present

## 2017-12-18 DIAGNOSIS — E440 Moderate protein-calorie malnutrition: Secondary | ICD-10-CM | POA: Diagnosis not present

## 2018-03-02 ENCOUNTER — Emergency Department (HOSPITAL_COMMUNITY): Payer: Medicare Other

## 2018-03-02 ENCOUNTER — Emergency Department (HOSPITAL_COMMUNITY)
Admission: EM | Admit: 2018-03-02 | Discharge: 2018-03-02 | Disposition: A | Discharge status: Home / Self Care | Payer: Medicare Other | Attending: Emergency Medicine | Admitting: Emergency Medicine

## 2018-03-02 ENCOUNTER — Encounter (HOSPITAL_COMMUNITY): Payer: Self-pay | Admitting: Emergency Medicine

## 2018-03-02 ENCOUNTER — Other Ambulatory Visit: Payer: Self-pay

## 2018-03-02 DIAGNOSIS — F0280 Dementia in other diseases classified elsewhere without behavioral disturbance: Secondary | ICD-10-CM | POA: Diagnosis not present

## 2018-03-02 DIAGNOSIS — I2510 Atherosclerotic heart disease of native coronary artery without angina pectoris: Secondary | ICD-10-CM | POA: Insufficient documentation

## 2018-03-02 DIAGNOSIS — S298XXA Other specified injuries of thorax, initial encounter: Secondary | ICD-10-CM | POA: Insufficient documentation

## 2018-03-02 DIAGNOSIS — J439 Emphysema, unspecified: Secondary | ICD-10-CM | POA: Diagnosis not present

## 2018-03-02 DIAGNOSIS — W19XXXA Unspecified fall, initial encounter: Secondary | ICD-10-CM | POA: Diagnosis not present

## 2018-03-02 DIAGNOSIS — R270 Ataxia, unspecified: Secondary | ICD-10-CM | POA: Diagnosis not present

## 2018-03-02 DIAGNOSIS — Z79899 Other long term (current) drug therapy: Secondary | ICD-10-CM | POA: Insufficient documentation

## 2018-03-02 DIAGNOSIS — E1122 Type 2 diabetes mellitus with diabetic chronic kidney disease: Secondary | ICD-10-CM | POA: Insufficient documentation

## 2018-03-02 DIAGNOSIS — I129 Hypertensive chronic kidney disease with stage 1 through stage 4 chronic kidney disease, or unspecified chronic kidney disease: Secondary | ICD-10-CM | POA: Insufficient documentation

## 2018-03-02 DIAGNOSIS — F17210 Nicotine dependence, cigarettes, uncomplicated: Secondary | ICD-10-CM | POA: Diagnosis not present

## 2018-03-02 DIAGNOSIS — G309 Alzheimer's disease, unspecified: Secondary | ICD-10-CM | POA: Diagnosis not present

## 2018-03-02 DIAGNOSIS — S0990XA Unspecified injury of head, initial encounter: Secondary | ICD-10-CM | POA: Diagnosis not present

## 2018-03-02 DIAGNOSIS — Z950 Presence of cardiac pacemaker: Secondary | ICD-10-CM | POA: Insufficient documentation

## 2018-03-02 DIAGNOSIS — Y939 Activity, unspecified: Secondary | ICD-10-CM | POA: Insufficient documentation

## 2018-03-02 DIAGNOSIS — S3993XA Unspecified injury of pelvis, initial encounter: Secondary | ICD-10-CM | POA: Diagnosis not present

## 2018-03-02 DIAGNOSIS — S199XXA Unspecified injury of neck, initial encounter: Secondary | ICD-10-CM | POA: Diagnosis not present

## 2018-03-02 DIAGNOSIS — S2241XA Multiple fractures of ribs, right side, initial encounter for closed fracture: Secondary | ICD-10-CM | POA: Diagnosis not present

## 2018-03-02 DIAGNOSIS — R0789 Other chest pain: Secondary | ICD-10-CM | POA: Diagnosis not present

## 2018-03-02 DIAGNOSIS — Y999 Unspecified external cause status: Secondary | ICD-10-CM | POA: Insufficient documentation

## 2018-03-02 DIAGNOSIS — N189 Chronic kidney disease, unspecified: Secondary | ICD-10-CM | POA: Insufficient documentation

## 2018-03-02 DIAGNOSIS — Y929 Unspecified place or not applicable: Secondary | ICD-10-CM | POA: Diagnosis not present

## 2018-03-02 LAB — COMPREHENSIVE METABOLIC PANEL
ALBUMIN: 3.8 g/dL (ref 3.5–5.0)
ALT: 14 U/L (ref 0–44)
ANION GAP: 12 (ref 5–15)
AST: 29 U/L (ref 15–41)
Alkaline Phosphatase: 81 U/L (ref 38–126)
BUN: 33 mg/dL — ABNORMAL HIGH (ref 8–23)
CHLORIDE: 98 mmol/L (ref 98–111)
CO2: 28 mmol/L (ref 22–32)
Calcium: 9.3 mg/dL (ref 8.9–10.3)
Creatinine, Ser: 1.53 mg/dL — ABNORMAL HIGH (ref 0.61–1.24)
GFR calc non Af Amer: 38 mL/min — ABNORMAL LOW (ref >60)
GFR, EST AFRICAN AMERICAN: 44 mL/min — AB (ref >60)
Glucose, Bld: 111 mg/dL — ABNORMAL HIGH (ref 70–99)
POTASSIUM: 3.8 mmol/L (ref 3.5–5.1)
SODIUM: 138 mmol/L (ref 135–145)
Total Bilirubin: 0.7 mg/dL (ref 0.3–1.2)
Total Protein: 7.6 g/dL (ref 6.5–8.1)

## 2018-03-02 LAB — CBC WITH DIFFERENTIAL/PLATELET
BASOS PCT: 0 %
Basophils Absolute: 0 10*3/uL (ref 0.0–0.1)
EOS ABS: 0 10*3/uL (ref 0.0–0.7)
EOS PCT: 0 %
HCT: 41.5 % (ref 39.0–52.0)
Hemoglobin: 13.7 g/dL (ref 13.0–17.0)
LYMPHS ABS: 1.7 10*3/uL (ref 0.7–4.0)
Lymphocytes Relative: 17 %
MCH: 30.2 pg (ref 26.0–34.0)
MCHC: 33 g/dL (ref 30.0–36.0)
MCV: 91.4 fL (ref 78.0–100.0)
Monocytes Absolute: 0.9 10*3/uL (ref 0.1–1.0)
Monocytes Relative: 10 %
Neutro Abs: 6.9 10*3/uL (ref 1.7–7.7)
Neutrophils Relative %: 73 %
PLATELETS: 224 10*3/uL (ref 150–400)
RBC: 4.54 MIL/uL (ref 4.22–5.81)
RDW: 13.5 % (ref 11.5–15.5)
WBC: 9.5 10*3/uL (ref 4.0–10.5)

## 2018-03-02 MED ORDER — HYDROMORPHONE HCL 4 MG PO TABS: 4.0000 mg | ORAL_TABLET | Freq: Four times a day (QID) | ORAL | 0 refills | Status: DC | PRN

## 2018-03-02 MED ORDER — HYDROMORPHONE HCL 1 MG/ML IJ SOLN
0.5000 mg | Freq: Once | INTRAMUSCULAR | Status: AC
  Administered 2018-03-02: 0.5 mg via INTRAVENOUS
  Filled 2018-03-02: qty 1

## 2018-03-02 MED ORDER — SODIUM CHLORIDE 0.9 % IV BOLUS (SEPSIS)
1000.0000 mL | Freq: Once | INTRAVENOUS | Status: AC
  Administered 2018-03-02: 1000 mL via INTRAVENOUS

## 2018-03-02 NOTE — ED Triage Notes (Signed)
Daughter states pt fell on Friday and has been having right rib pain.

## 2018-03-02 NOTE — Discharge Instructions (Addendum)
Drink plenty of fluids.  Follow-up with your doctor the end of this week for recheck.  Use the incentive spirometry to take deep breaths

## 2018-03-02 NOTE — ED Provider Notes (Signed)
[facility] EMERGENCY DEPARTMENT Provider Note   CSN: [id number] Arrival date & time: 03/02/18  1452     History   Chief Complaint Chief Complaint  Patient presents with  . Fall    HPI Bruce French is a 84 y.o. male.  Patient states that he fell 3 days ago and complains of right chest pain.  Unknown loss of consciousness.  The history is provided by the patient and a relative. No language interpreter was used.  Fall  This is a new problem. The current episode started more than 2 days ago. The problem occurs rarely. The problem has been resolved. Associated symptoms include chest pain. Pertinent negatives include no abdominal pain and no headaches. Exacerbated by: Deep breath. Nothing relieves the symptoms. Treatments tried: Oxycodone. The treatment provided no relief.    Past Medical History:  Diagnosis Date  . Alzheimer disease   . Blurry vision, right eye   . Cataract extraction status of right eye   . Cataract, right eye   . Coronary artery disease   . Enlarged prostate   . Glaucoma   . Heart block    s/p PPM INSERT  . Hyperlipidemia   . Hypertension   . Stroke [facility])     Patient Active Problem List   Diagnosis Date Noted  . Perforated duodenal ulcer ([place]) 11/22/2015  . Perforated ulcer of intestine ([place]) 11/22/2015  . Sepsis ([place]) 11/22/2015  . Emphysema lung ([place]) 11/22/2015  . Coronary artery disease 11/22/2015  . S/P placement of cardiac pacemaker 04/05/2013  . HTN (hypertension) 01/04/2013  . 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 COPD (chronic obstructive pulmonary disease) ([place]) 01/04/2013  . Dementia 01/04/2013  . CKD (chronic kidney disease) 01/04/2013  . Diabetes type 2, controlled ([place]) 01/04/2013  . Hyperlipidemia 01/04/2013  . Depressive disorder 01/04/2013  . Emphysema 01/04/2013  . Cerebrovascular accident (stroke) ([place]) 01/04/2013    Past Surgical History:  Procedure Laterality Date  . INSERT / REPLACE / REMOVE  PACEMAKER  04/13/04   INSERT PPM MEDTRONIC/Model Number:  [id number]     PPM Serial Number:  [id number]  . PERMANENT PACEMAKER GENERATOR CHANGE N/A 01/07/2013   Procedure: PERMANENT PACEMAKER GENERATOR CHANGE;  Surgeon: Evans Lance, MD;  Location: [facility] CATH LAB;  Service: Cardiovascular;  Laterality: N/A;        Home Medications    Prior to Admission medications   Medication Sig Start Date End Date Taking? Authorizing Provider  Ascorbic Acid (VITAMIN C) 1000 MG tablet Take 1,000 mg by mouth daily.    [provider]  atorvastatin (LIPITOR) 20 MG tablet Take 20 mg by mouth daily.    [provider]  Cholecalciferol (VITAMIN D-3) 5000 UNITS TABS Take 5,000 Units by mouth daily.    [provider]  ciprofloxacin (CIPRO) 500 MG tablet Take 1 tablet (500 mg total) by mouth 2 (two) times daily. 11/27/15   Isaac Bliss, Rayford Halsted, MD  donepezil (ARICEPT) 10 MG tablet Take 10 mg by mouth daily.    [provider]  DULoxetine (CYMBALTA) 30 MG capsule Take 30 mg by mouth daily.    [provider]  dutasteride (AVODART) 0.5 MG capsule Take 0.5 mg by mouth daily.    [provider]  HYDROmorphone (DILAUDID) 4 MG tablet Take 1 tablet (4 mg total) by mouth every 6 (six) hours as needed for severe pain. 03/02/18   Milton Ferguson, MD  Multiple Vitamin (MULTIVITAMIN WITH MINERALS) TABS Take 1 tablet by mouth  daily.    [provider]  pantoprazole (PROTONIX) 40 MG tablet Take 1 tablet (40 mg total) by mouth 2 (two) times daily. 11/27/15   Isaac Bliss, Rayford Halsted, MD  valsartan-hydrochlorothiazide (DIOVAN-HCT) 160-12.5 MG per tablet Take 1 tablet by mouth daily.    [provider]    Family History History reviewed. No pertinent family history.  Social History Social History   Tobacco Use  . Smoking status: Current Every Day Smoker    Packs/day: 1.00    Years: 80.00    Pack years: 80.00    Types: Cigarettes  Substance Use  Topics  . Alcohol use: No  . Drug use: No     Allergies   Xanax xr [alprazolam er]   Review of Systems Review of Systems  Constitutional: Negative for appetite change and fatigue.  HENT: Negative for congestion, ear discharge and sinus pressure.   Eyes: Negative for discharge.  Respiratory: Negative for cough.   Cardiovascular: Positive for chest pain.  Gastrointestinal: Negative for abdominal pain and diarrhea.  Genitourinary: Negative for frequency and hematuria.  Musculoskeletal: Negative for back pain.  Skin: Negative for rash.  Neurological: Negative for seizures and headaches.  Psychiatric/Behavioral: Negative for hallucinations.     Physical Exam Updated Vital Signs BP (!) 146/80   Pulse 64   Temp (!) 97.5 F (36.4 C) (Oral)   Resp 20   Ht 5\' 8"  (1.727 m)   Wt 59 kg   SpO2 99%   BMI 19.77 kg/m   Physical Exam  Constitutional: He is oriented to person, place, and time. He appears well-developed.  HENT:  Head: Normocephalic.  Eyes: Conjunctivae and EOM are normal. No scleral icterus.  Neck: Neck supple. No thyromegaly present.  Cardiovascular: Normal rate and regular rhythm. Exam reveals no gallop and no friction rub.  No murmur heard. Pulmonary/Chest: No stridor. He has no wheezes. He has no rales. He exhibits tenderness.  Right lateral chest tenderness  Abdominal: He exhibits no distension. There is no tenderness. There is no rebound.  Musculoskeletal: Normal range of motion. He exhibits no edema.  Lymphadenopathy:    He has no cervical adenopathy.  Neurological: He is oriented to person, place, and time. He exhibits normal muscle tone. Coordination normal.  Skin: No rash noted. No erythema.  Psychiatric: He has a normal mood and affect. His behavior is normal.     ED Treatments / Results  Labs (all labs ordered are listed, but only abnormal results are displayed) Labs Reviewed  COMPREHENSIVE METABOLIC PANEL - Abnormal; Notable for the following  components:      Result Value   Glucose, Bld 111 (*)    BUN 33 (*)    Creatinine, Ser 1.53 (*)    GFR calc non Af Amer 38 (*)    GFR calc Af Amer 44 (*)    All other components within normal limits  CBC WITH DIFFERENTIAL/PLATELET    EKG None  Radiology Dg Ribs Unilateral W/chest Right  Result Date: 03/02/2018 CLINICAL DATA:  [place] on Friday with right anterior rib pain. EXAM: RIGHT RIBS AND CHEST - 3+ VIEW COMPARISON:  11/22/2015 and chest CT 06/12/2017 FINDINGS: Heart size is within normal limits and stable. Atherosclerotic calcifications at the aortic arch. Stable position of the left dual-chamber cardiac pacemaker. Negative for a pneumothorax. No focal lung disease. Marker was placed in the right chest. Evidence for displaced fractures involving the distal aspect of the right eleventh, ninth and eighth ribs. IMPRESSION: Fractures of the  right eighth, ninth and eleventh ribs. Electronically Signed   By: Markus Daft M.D.   On: 03/02/2018 18:54   Dg Pelvis 1-2 Views  Result Date: 03/02/2018 CLINICAL DATA:  Patient fell Friday and has had right anterior rib pain. EXAM: PELVIS - 1-2 VIEW COMPARISON:  CT 11/22/2015 of the pelvis FINDINGS: Advanced degenerative disc disease L4-5 and L5-S1 with facet arthrosis. Mild-to-moderate joint space narrowing of both hips compatible with osteoarthritis. No acute hip fracture. Small bone island of the left femoral neck. The bony pelvis appears intact. Soft tissue calcification adjacent to the right greater trochanter compatible with gluteal tendinopathy is seen. Moderate stool retention is seen in the rectosigmoid obscuring lower sacrum and coccyx. IMPRESSION: 1. Lower lumbar degenerative disc and facet arthropathy. 2. Mild osteoarthritic joint space narrowing of both hips. No acute pelvic or hip fracture is identified. 3. Soft tissue calcification adjacent to the greater trochanter on the right compatible with calcific gluteal tendinopathy. Electronically Signed    By: Ashley Royalty M.D.   On: 03/02/2018 18:54   Ct Head Wo Contrast  Result Date: 03/02/2018 CLINICAL DATA:  Patient fell on Friday.  Ataxia.  History of stroke. EXAM: CT HEAD WITHOUT CONTRAST CT CERVICAL SPINE WITHOUT CONTRAST TECHNIQUE: Multidetector CT imaging of the head and cervical spine was performed following the standard protocol without intravenous contrast. Multiplanar CT image reconstructions of the cervical spine were also generated. COMPARISON:  03/06/2013 head CT FINDINGS: CT HEAD FINDINGS BRAIN: There is sulcal and ventricular prominence consistent with superficial and central atrophy, the superficial atrophy more accentuated on the right with greater sulcal prominence seen. This is a chronic stable appearing finding however. No intraparenchymal hemorrhage, mass effect nor midline shift. Periventricular and subcortical white matter hypodensities consistent with chronic small vessel ischemic disease are identified. No acute large vascular territory infarcts. No abnormal extra-axial fluid collections. Basal cisterns are not effaced and midline. VASCULAR: Moderate calcific atherosclerosis of the carotid siphons. SKULL: No skull fracture. No significant scalp soft tissue swelling. SINUSES/ORBITS: The mastoid air-cells are clear. The included paranasal sinuses are well-aerated.The included ocular globes and orbital contents are non-suspicious. Bilateral mastoid effusions compatible with chronic otomastoiditis. Cerumen left external auditory canal. OTHER: Scattered right greater than left parotid calcifications consistent with sialoliths or stigmata of old remote inflammation. CT CERVICAL SPINE FINDINGS Alignment: Maintained cervical lordosis. Intact craniocervical relationship. Skull base and vertebrae: Intact skull base. No cervical spine fracture. Soft tissues and spinal canal: No prevertebral fluid or swelling. No visible canal hematoma. Disc levels: Mild-to-moderate disc flattening C3 through C6,  greatest at C5-6. Bilateral facet arthrosis with joint space narrowing and spurring. Bilateral uncovertebral joint osteoarthritis with uncinate spurring at C4-5, C5-6 and C6-7. These contribute to bilateral mild foraminal encroachment, greatest on the right at C5-6. Right-sided uncinate spurring at C3-4 as well. Upper chest: Mild paraseptal emphysema at the apices. Other: Moderate extracranial carotid arteriosclerosis.Parotid calcifications noted bilaterally. IMPRESSION: CT head: 1. Redemonstration of superficial atrophy, greater on the right with chronic small vessel ischemia. No acute intracranial abnormality. 2. Chronic bilateral otomastoiditis with bilateral mastoid effusions. Cerumen in the left external auditory canal as before. CT cervical spine: 1. No acute cervical spine fracture. 2. Cervical spondylosis with disc flattening C3 through C6, greatest at C5-6. Multilevel cervical uncovertebral joint and facet arthrosis. 3. Bilateral parotid gland calcifications query remote postinfectious/inflammatory change or sialoliths. Electronically Signed   By: Ashley Royalty M.D.   On: 03/02/2018 19:05   Ct Cervical Spine Wo Contrast  Result Date: 03/02/2018  CLINICAL DATA:  Patient fell on Friday.  Ataxia.  History of stroke. EXAM: CT HEAD WITHOUT CONTRAST CT CERVICAL SPINE WITHOUT CONTRAST TECHNIQUE: Multidetector CT imaging of the head and cervical spine was performed following the standard protocol without intravenous contrast. Multiplanar CT image reconstructions of the cervical spine were also generated. COMPARISON:  03/06/2013 head CT FINDINGS: CT HEAD FINDINGS BRAIN: There is sulcal and ventricular prominence consistent with superficial and central atrophy, the superficial atrophy more accentuated on the right with greater sulcal prominence seen. This is a chronic stable appearing finding however. No intraparenchymal hemorrhage, mass effect nor midline shift. Periventricular and subcortical white matter  hypodensities consistent with chronic small vessel ischemic disease are identified. No acute large vascular territory infarcts. No abnormal extra-axial fluid collections. Basal cisterns are not effaced and midline. VASCULAR: Moderate calcific atherosclerosis of the carotid siphons. SKULL: No skull fracture. No significant scalp soft tissue swelling. SINUSES/ORBITS: The mastoid air-cells are clear. The included paranasal sinuses are well-aerated.The included ocular globes and orbital contents are non-suspicious. Bilateral mastoid effusions compatible with chronic otomastoiditis. Cerumen left external auditory canal. OTHER: Scattered right greater than left parotid calcifications consistent with sialoliths or stigmata of old remote inflammation. CT CERVICAL SPINE FINDINGS Alignment: Maintained cervical lordosis. Intact craniocervical relationship. Skull base and vertebrae: Intact skull base. No cervical spine fracture. Soft tissues and spinal canal: No prevertebral fluid or swelling. No visible canal hematoma. Disc levels: Mild-to-moderate disc flattening C3 through C6, greatest at C5-6. Bilateral facet arthrosis with joint space narrowing and spurring. Bilateral uncovertebral joint osteoarthritis with uncinate spurring at C4-5, C5-6 and C6-7. These contribute to bilateral mild foraminal encroachment, greatest on the right at C5-6. Right-sided uncinate spurring at C3-4 as well. Upper chest: Mild paraseptal emphysema at the apices. Other: Moderate extracranial carotid arteriosclerosis.Parotid calcifications noted bilaterally. IMPRESSION: CT head: 1. Redemonstration of superficial atrophy, greater on the right with chronic small vessel ischemia. No acute intracranial abnormality. 2. Chronic bilateral otomastoiditis with bilateral mastoid effusions. Cerumen in the left external auditory canal as before. CT cervical spine: 1. No acute cervical spine fracture. 2. Cervical spondylosis with disc flattening C3 through C6,  greatest at C5-6. Multilevel cervical uncovertebral joint and facet arthrosis. 3. Bilateral parotid gland calcifications query remote postinfectious/inflammatory change or sialoliths. Electronically Signed   By: Ashley Royalty M.D.   On: 03/02/2018 19:05    Procedures Procedures (including critical care time)  Medications Ordered in ED Medications  HYDROmorphone (DILAUDID) injection 0.5 mg (has no administration in time range)  sodium chloride 0.9 % bolus 1,000 mL (0 mLs Intravenous Stopped 03/02/18 1919)  HYDROmorphone (DILAUDID) injection 0.5 mg (0.5 mg Intravenous Given 03/02/18 1919)     Initial Impression / Assessment and Plan / ED Course  I have reviewed the triage vital signs and the nursing notes.  Pertinent labs & imaging results that were available during my care of the patient were reviewed by me and considered in my medical decision making (see chart for details).     Chest x-ray single 3 broken ribs.  Patient mildly dehydrated.  He was giving 1 L of fluids to hydrate him.  Patient will be discharged home with pain medicines and incentive spirometry and will follow-up with his PCP  Final Clinical Impressions(s) / ED Diagnoses   Final diagnoses:  Fall, initial encounter    ED Discharge Orders         Ordered    HYDROmorphone (DILAUDID) 4 MG tablet  Every 6 hours PRN     03/02/18  [id number]           Milton Ferguson, MD 03/02/18 2001

## 2018-03-09 DIAGNOSIS — E440 Moderate protein-calorie malnutrition: Secondary | ICD-10-CM | POA: Diagnosis not present

## 2018-03-09 DIAGNOSIS — Z681 Body mass index (BMI) 19 or less, adult: Secondary | ICD-10-CM | POA: Diagnosis not present

## 2018-03-09 DIAGNOSIS — F17210 Nicotine dependence, cigarettes, uncomplicated: Secondary | ICD-10-CM | POA: Diagnosis not present

## 2018-03-09 DIAGNOSIS — S2241XD Multiple fractures of ribs, right side, subsequent encounter for fracture with routine healing: Secondary | ICD-10-CM | POA: Diagnosis not present

## 2018-03-23 DIAGNOSIS — I10 Essential (primary) hypertension: Secondary | ICD-10-CM | POA: Diagnosis not present

## 2018-03-23 DIAGNOSIS — F17210 Nicotine dependence, cigarettes, uncomplicated: Secondary | ICD-10-CM | POA: Diagnosis not present

## 2018-03-23 DIAGNOSIS — E782 Mixed hyperlipidemia: Secondary | ICD-10-CM | POA: Diagnosis not present

## 2018-03-23 DIAGNOSIS — K219 Gastro-esophageal reflux disease without esophagitis: Secondary | ICD-10-CM | POA: Diagnosis not present

## 2018-03-23 DIAGNOSIS — Z950 Presence of cardiac pacemaker: Secondary | ICD-10-CM | POA: Diagnosis not present

## 2018-03-23 DIAGNOSIS — I639 Cerebral infarction, unspecified: Secondary | ICD-10-CM | POA: Diagnosis not present

## 2018-03-23 DIAGNOSIS — E861 Hypovolemia: Secondary | ICD-10-CM | POA: Diagnosis not present

## 2018-03-23 DIAGNOSIS — E1122 Type 2 diabetes mellitus with diabetic chronic kidney disease: Secondary | ICD-10-CM | POA: Diagnosis not present

## 2018-03-23 DIAGNOSIS — R634 Abnormal weight loss: Secondary | ICD-10-CM | POA: Diagnosis not present

## 2018-03-23 DIAGNOSIS — E1165 Type 2 diabetes mellitus with hyperglycemia: Secondary | ICD-10-CM | POA: Diagnosis not present

## 2018-03-26 DIAGNOSIS — N183 Chronic kidney disease, stage 3 (moderate): Secondary | ICD-10-CM | POA: Diagnosis not present

## 2018-03-26 DIAGNOSIS — Z681 Body mass index (BMI) 19 or less, adult: Secondary | ICD-10-CM | POA: Diagnosis not present

## 2018-03-26 DIAGNOSIS — N400 Enlarged prostate without lower urinary tract symptoms: Secondary | ICD-10-CM | POA: Diagnosis not present

## 2018-03-26 DIAGNOSIS — J439 Emphysema, unspecified: Secondary | ICD-10-CM | POA: Diagnosis not present

## 2018-03-26 DIAGNOSIS — S2241XD Multiple fractures of ribs, right side, subsequent encounter for fracture with routine healing: Secondary | ICD-10-CM | POA: Diagnosis not present

## 2018-03-26 DIAGNOSIS — I714 Abdominal aortic aneurysm, without rupture: Secondary | ICD-10-CM | POA: Diagnosis not present

## 2018-03-26 DIAGNOSIS — E1122 Type 2 diabetes mellitus with diabetic chronic kidney disease: Secondary | ICD-10-CM | POA: Diagnosis not present

## 2018-03-26 DIAGNOSIS — E440 Moderate protein-calorie malnutrition: Secondary | ICD-10-CM | POA: Diagnosis not present

## 2018-06-25 DIAGNOSIS — E861 Hypovolemia: Secondary | ICD-10-CM | POA: Diagnosis not present

## 2018-06-25 DIAGNOSIS — I10 Essential (primary) hypertension: Secondary | ICD-10-CM | POA: Diagnosis not present

## 2018-06-25 DIAGNOSIS — K219 Gastro-esophageal reflux disease without esophagitis: Secondary | ICD-10-CM | POA: Diagnosis not present

## 2018-06-25 DIAGNOSIS — E1165 Type 2 diabetes mellitus with hyperglycemia: Secondary | ICD-10-CM | POA: Diagnosis not present

## 2018-06-25 DIAGNOSIS — E1122 Type 2 diabetes mellitus with diabetic chronic kidney disease: Secondary | ICD-10-CM | POA: Diagnosis not present

## 2018-06-25 DIAGNOSIS — E782 Mixed hyperlipidemia: Secondary | ICD-10-CM | POA: Diagnosis not present

## 2018-06-25 DIAGNOSIS — N183 Chronic kidney disease, stage 3 (moderate): Secondary | ICD-10-CM | POA: Diagnosis not present

## 2018-06-25 DIAGNOSIS — R634 Abnormal weight loss: Secondary | ICD-10-CM | POA: Diagnosis not present

## 2018-06-29 DIAGNOSIS — J479 Bronchiectasis, uncomplicated: Secondary | ICD-10-CM | POA: Diagnosis not present

## 2018-06-29 DIAGNOSIS — I714 Abdominal aortic aneurysm, without rupture: Secondary | ICD-10-CM | POA: Diagnosis not present

## 2018-06-29 DIAGNOSIS — Z950 Presence of cardiac pacemaker: Secondary | ICD-10-CM | POA: Diagnosis not present

## 2018-06-29 DIAGNOSIS — R2681 Unsteadiness on feet: Secondary | ICD-10-CM | POA: Diagnosis not present

## 2018-06-29 DIAGNOSIS — M545 Low back pain: Secondary | ICD-10-CM | POA: Diagnosis not present

## 2018-06-29 DIAGNOSIS — I712 Thoracic aortic aneurysm, without rupture: Secondary | ICD-10-CM | POA: Diagnosis not present

## 2018-06-29 DIAGNOSIS — J439 Emphysema, unspecified: Secondary | ICD-10-CM | POA: Diagnosis not present

## 2018-06-29 DIAGNOSIS — F17210 Nicotine dependence, cigarettes, uncomplicated: Secondary | ICD-10-CM | POA: Diagnosis not present

## 2018-06-29 DIAGNOSIS — M5416 Radiculopathy, lumbar region: Secondary | ICD-10-CM | POA: Diagnosis not present

## 2018-06-29 DIAGNOSIS — E440 Moderate protein-calorie malnutrition: Secondary | ICD-10-CM | POA: Diagnosis not present

## 2018-06-29 DIAGNOSIS — Z681 Body mass index (BMI) 19 or less, adult: Secondary | ICD-10-CM | POA: Diagnosis not present

## 2018-06-29 DIAGNOSIS — E1122 Type 2 diabetes mellitus with diabetic chronic kidney disease: Secondary | ICD-10-CM | POA: Diagnosis not present

## 2018-06-29 DIAGNOSIS — K5903 Drug induced constipation: Secondary | ICD-10-CM | POA: Diagnosis not present

## 2018-06-29 DIAGNOSIS — E782 Mixed hyperlipidemia: Secondary | ICD-10-CM | POA: Diagnosis not present

## 2018-07-02 DIAGNOSIS — J439 Emphysema, unspecified: Secondary | ICD-10-CM | POA: Diagnosis not present

## 2018-07-02 DIAGNOSIS — R911 Solitary pulmonary nodule: Secondary | ICD-10-CM | POA: Diagnosis not present

## 2018-07-02 DIAGNOSIS — I712 Thoracic aortic aneurysm, without rupture: Secondary | ICD-10-CM | POA: Diagnosis not present

## 2018-07-02 DIAGNOSIS — I700 Atherosclerosis of aorta: Secondary | ICD-10-CM | POA: Diagnosis not present

## 2018-07-02 DIAGNOSIS — F17210 Nicotine dependence, cigarettes, uncomplicated: Secondary | ICD-10-CM | POA: Diagnosis not present

## 2018-09-30 DIAGNOSIS — F17210 Nicotine dependence, cigarettes, uncomplicated: Secondary | ICD-10-CM | POA: Diagnosis not present

## 2018-09-30 DIAGNOSIS — E1165 Type 2 diabetes mellitus with hyperglycemia: Secondary | ICD-10-CM | POA: Diagnosis not present

## 2018-09-30 DIAGNOSIS — E440 Moderate protein-calorie malnutrition: Secondary | ICD-10-CM | POA: Diagnosis not present

## 2018-09-30 DIAGNOSIS — M545 Low back pain: Secondary | ICD-10-CM | POA: Diagnosis not present

## 2018-09-30 DIAGNOSIS — E782 Mixed hyperlipidemia: Secondary | ICD-10-CM | POA: Diagnosis not present

## 2018-09-30 DIAGNOSIS — E1122 Type 2 diabetes mellitus with diabetic chronic kidney disease: Secondary | ICD-10-CM | POA: Diagnosis not present

## 2018-09-30 DIAGNOSIS — Z0001 Encounter for general adult medical examination with abnormal findings: Secondary | ICD-10-CM | POA: Diagnosis not present

## 2018-09-30 DIAGNOSIS — R2681 Unsteadiness on feet: Secondary | ICD-10-CM | POA: Diagnosis not present

## 2018-09-30 DIAGNOSIS — J439 Emphysema, unspecified: Secondary | ICD-10-CM | POA: Diagnosis not present

## 2018-09-30 DIAGNOSIS — M5416 Radiculopathy, lumbar region: Secondary | ICD-10-CM | POA: Diagnosis not present

## 2018-09-30 DIAGNOSIS — I712 Thoracic aortic aneurysm, without rupture: Secondary | ICD-10-CM | POA: Diagnosis not present

## 2018-09-30 DIAGNOSIS — I714 Abdominal aortic aneurysm, without rupture: Secondary | ICD-10-CM | POA: Diagnosis not present

## 2018-09-30 DIAGNOSIS — R911 Solitary pulmonary nodule: Secondary | ICD-10-CM | POA: Diagnosis not present

## 2018-09-30 DIAGNOSIS — K5903 Drug induced constipation: Secondary | ICD-10-CM | POA: Diagnosis not present

## 2018-09-30 DIAGNOSIS — J479 Bronchiectasis, uncomplicated: Secondary | ICD-10-CM | POA: Diagnosis not present

## 2018-09-30 DIAGNOSIS — Z681 Body mass index (BMI) 19 or less, adult: Secondary | ICD-10-CM | POA: Diagnosis not present

## 2018-09-30 DIAGNOSIS — I10 Essential (primary) hypertension: Secondary | ICD-10-CM | POA: Diagnosis not present

## 2018-12-31 DIAGNOSIS — J479 Bronchiectasis, uncomplicated: Secondary | ICD-10-CM | POA: Diagnosis not present

## 2018-12-31 DIAGNOSIS — E1122 Type 2 diabetes mellitus with diabetic chronic kidney disease: Secondary | ICD-10-CM | POA: Diagnosis not present

## 2018-12-31 DIAGNOSIS — F17210 Nicotine dependence, cigarettes, uncomplicated: Secondary | ICD-10-CM | POA: Diagnosis not present

## 2018-12-31 DIAGNOSIS — Z681 Body mass index (BMI) 19 or less, adult: Secondary | ICD-10-CM | POA: Diagnosis not present

## 2018-12-31 DIAGNOSIS — M4317 Spondylolisthesis, lumbosacral region: Secondary | ICD-10-CM | POA: Diagnosis not present

## 2018-12-31 DIAGNOSIS — R911 Solitary pulmonary nodule: Secondary | ICD-10-CM | POA: Diagnosis not present

## 2018-12-31 DIAGNOSIS — J439 Emphysema, unspecified: Secondary | ICD-10-CM | POA: Diagnosis not present

## 2018-12-31 DIAGNOSIS — I714 Abdominal aortic aneurysm, without rupture: Secondary | ICD-10-CM | POA: Diagnosis not present

## 2019-02-02 DIAGNOSIS — I712 Thoracic aortic aneurysm, without rupture: Secondary | ICD-10-CM | POA: Diagnosis not present

## 2019-02-02 DIAGNOSIS — I714 Abdominal aortic aneurysm, without rupture: Secondary | ICD-10-CM | POA: Diagnosis not present

## 2019-02-02 DIAGNOSIS — Z681 Body mass index (BMI) 19 or less, adult: Secondary | ICD-10-CM | POA: Diagnosis not present

## 2019-02-02 DIAGNOSIS — E1122 Type 2 diabetes mellitus with diabetic chronic kidney disease: Secondary | ICD-10-CM | POA: Diagnosis not present

## 2019-02-02 DIAGNOSIS — R911 Solitary pulmonary nodule: Secondary | ICD-10-CM | POA: Diagnosis not present

## 2019-02-02 DIAGNOSIS — Z950 Presence of cardiac pacemaker: Secondary | ICD-10-CM | POA: Diagnosis not present

## 2019-02-02 DIAGNOSIS — J479 Bronchiectasis, uncomplicated: Secondary | ICD-10-CM | POA: Diagnosis not present

## 2019-02-02 DIAGNOSIS — F17210 Nicotine dependence, cigarettes, uncomplicated: Secondary | ICD-10-CM | POA: Diagnosis not present

## 2019-03-25 DIAGNOSIS — E1122 Type 2 diabetes mellitus with diabetic chronic kidney disease: Secondary | ICD-10-CM | POA: Diagnosis not present

## 2019-03-25 DIAGNOSIS — Z79899 Other long term (current) drug therapy: Secondary | ICD-10-CM | POA: Diagnosis not present

## 2019-03-25 DIAGNOSIS — N182 Chronic kidney disease, stage 2 (mild): Secondary | ICD-10-CM | POA: Diagnosis not present

## 2019-03-25 DIAGNOSIS — E861 Hypovolemia: Secondary | ICD-10-CM | POA: Diagnosis not present

## 2019-03-25 DIAGNOSIS — E782 Mixed hyperlipidemia: Secondary | ICD-10-CM | POA: Diagnosis not present

## 2019-03-30 DIAGNOSIS — M4317 Spondylolisthesis, lumbosacral region: Secondary | ICD-10-CM | POA: Diagnosis not present

## 2019-03-30 DIAGNOSIS — R2681 Unsteadiness on feet: Secondary | ICD-10-CM | POA: Diagnosis not present

## 2019-03-30 DIAGNOSIS — Z950 Presence of cardiac pacemaker: Secondary | ICD-10-CM | POA: Diagnosis not present

## 2019-03-30 DIAGNOSIS — J439 Emphysema, unspecified: Secondary | ICD-10-CM | POA: Diagnosis not present

## 2019-03-30 DIAGNOSIS — Z23 Encounter for immunization: Secondary | ICD-10-CM | POA: Diagnosis not present

## 2019-03-30 DIAGNOSIS — I714 Abdominal aortic aneurysm, without rupture: Secondary | ICD-10-CM | POA: Diagnosis not present

## 2019-03-30 DIAGNOSIS — Z681 Body mass index (BMI) 19 or less, adult: Secondary | ICD-10-CM | POA: Diagnosis not present

## 2019-03-30 DIAGNOSIS — E1122 Type 2 diabetes mellitus with diabetic chronic kidney disease: Secondary | ICD-10-CM | POA: Diagnosis not present

## 2019-04-13 ENCOUNTER — Other Ambulatory Visit: Payer: Self-pay

## 2019-04-13 ENCOUNTER — Emergency Department (HOSPITAL_COMMUNITY)
Admission: EM | Admit: 2019-04-13 | Discharge: 2019-04-13 | Disposition: A | Discharge status: Home / Self Care | Payer: Medicare Other | Attending: Emergency Medicine | Admitting: Emergency Medicine

## 2019-04-13 ENCOUNTER — Encounter (HOSPITAL_COMMUNITY): Payer: Self-pay | Admitting: Emergency Medicine

## 2019-04-13 ENCOUNTER — Emergency Department (HOSPITAL_COMMUNITY): Payer: Medicare Other

## 2019-04-13 DIAGNOSIS — E1122 Type 2 diabetes mellitus with diabetic chronic kidney disease: Secondary | ICD-10-CM | POA: Insufficient documentation

## 2019-04-13 DIAGNOSIS — J449 Chronic obstructive pulmonary disease, unspecified: Secondary | ICD-10-CM | POA: Insufficient documentation

## 2019-04-13 DIAGNOSIS — G309 Alzheimer's disease, unspecified: Secondary | ICD-10-CM | POA: Diagnosis not present

## 2019-04-13 DIAGNOSIS — N189 Chronic kidney disease, unspecified: Secondary | ICD-10-CM | POA: Insufficient documentation

## 2019-04-13 DIAGNOSIS — Y9389 Activity, other specified: Secondary | ICD-10-CM | POA: Diagnosis not present

## 2019-04-13 DIAGNOSIS — Y999 Unspecified external cause status: Secondary | ICD-10-CM | POA: Diagnosis not present

## 2019-04-13 DIAGNOSIS — R519 Headache, unspecified: Secondary | ICD-10-CM | POA: Diagnosis not present

## 2019-04-13 DIAGNOSIS — S161XXA Strain of muscle, fascia and tendon at neck level, initial encounter: Secondary | ICD-10-CM | POA: Diagnosis not present

## 2019-04-13 DIAGNOSIS — Y9289 Other specified places as the place of occurrence of the external cause: Secondary | ICD-10-CM | POA: Diagnosis not present

## 2019-04-13 DIAGNOSIS — F17210 Nicotine dependence, cigarettes, uncomplicated: Secondary | ICD-10-CM | POA: Insufficient documentation

## 2019-04-13 DIAGNOSIS — F0280 Dementia in other diseases classified elsewhere without behavioral disturbance: Secondary | ICD-10-CM | POA: Diagnosis not present

## 2019-04-13 DIAGNOSIS — I259 Chronic ischemic heart disease, unspecified: Secondary | ICD-10-CM | POA: Insufficient documentation

## 2019-04-13 DIAGNOSIS — I129 Hypertensive chronic kidney disease with stage 1 through stage 4 chronic kidney disease, or unspecified chronic kidney disease: Secondary | ICD-10-CM | POA: Diagnosis not present

## 2019-04-13 DIAGNOSIS — X58XXXA Exposure to other specified factors, initial encounter: Secondary | ICD-10-CM | POA: Diagnosis not present

## 2019-04-13 DIAGNOSIS — G44209 Tension-type headache, unspecified, not intractable: Secondary | ICD-10-CM | POA: Diagnosis not present

## 2019-04-13 DIAGNOSIS — M542 Cervicalgia: Secondary | ICD-10-CM | POA: Diagnosis not present

## 2019-04-13 LAB — CBC WITH DIFFERENTIAL/PLATELET
Abs Immature Granulocytes: 0.03 10*3/uL (ref 0.00–0.07)
Basophils Absolute: 0 10*3/uL (ref 0.0–0.1)
Basophils Relative: 0 %
Eosinophils Absolute: 0 10*3/uL (ref 0.0–0.5)
Eosinophils Relative: 0 %
HCT: 43.8 % (ref 39.0–52.0)
Hemoglobin: 13.8 g/dL (ref 13.0–17.0)
Immature Granulocytes: 0 %
Lymphocytes Relative: 8 %
Lymphs Abs: 0.9 10*3/uL (ref 0.7–4.0)
MCH: 28.5 pg (ref 26.0–34.0)
MCHC: 31.5 g/dL (ref 30.0–36.0)
MCV: 90.3 fL (ref 80.0–100.0)
Monocytes Absolute: 0.6 10*3/uL (ref 0.1–1.0)
Monocytes Relative: 6 %
Neutro Abs: 9.3 10*3/uL — ABNORMAL HIGH (ref 1.7–7.7)
Neutrophils Relative %: 86 %
Platelets: 263 10*3/uL (ref 150–400)
RBC: 4.85 MIL/uL (ref 4.22–5.81)
RDW: 14.4 % (ref 11.5–15.5)
WBC: 10.9 10*3/uL — ABNORMAL HIGH (ref 4.0–10.5)
nRBC: 0 % (ref 0.0–0.2)

## 2019-04-13 LAB — BASIC METABOLIC PANEL
Anion gap: 12 (ref 5–15)
BUN: 23 mg/dL (ref 8–23)
CO2: 27 mmol/L (ref 22–32)
Calcium: 9.3 mg/dL (ref 8.9–10.3)
Chloride: 98 mmol/L (ref 98–111)
Creatinine, Ser: 1.2 mg/dL (ref 0.61–1.24)
GFR calc Af Amer: >60 mL/min (ref >60)
GFR calc non Af Amer: 53 mL/min — ABNORMAL LOW (ref >60)
Glucose, Bld: 110 mg/dL — ABNORMAL HIGH (ref 70–99)
Potassium: 4.1 mmol/L (ref 3.5–5.1)
Sodium: 137 mmol/L (ref 135–145)

## 2019-04-13 MED ORDER — DICLOFENAC SODIUM 1 % TD GEL: 2.0000 g | Freq: Four times a day (QID) | TRANSDERMAL | 0 refills | Status: AC | PRN

## 2019-04-13 MED ORDER — MORPHINE SULFATE (PF) 4 MG/ML IV SOLN
4.0000 mg | Freq: Once | INTRAVENOUS | Status: AC
  Administered 2019-04-13: 4 mg via INTRAVENOUS
  Filled 2019-04-13: qty 1

## 2019-04-13 MED ORDER — LIDOCAINE 5 % EX OINT: 1.0000 "application " | TOPICAL_OINTMENT | Freq: Three times a day (TID) | CUTANEOUS | 0 refills | Status: AC | PRN

## 2019-04-13 NOTE — Discharge Instructions (Signed)
You were seen in the emergency department today with neck pain and headache.  I suspect that your pain is caused by muscle spasms in your neck.  You may take the occasions provided.  Please alternate between the 2 until you find one that works best.  Call your primary care doctor for an urgent follow-up appointment.  Return to the emergency department with any confusion, fever, weakness, or other sudden worsening symptoms.

## 2019-04-13 NOTE — ED Notes (Signed)
Patient transported to CT 

## 2019-04-13 NOTE — ED Triage Notes (Signed)
Patient complaining of headache radiating into neck and bilateral shoulders since 1900 last night.

## 2019-04-13 NOTE — ED Provider Notes (Signed)
Emergency Department Provider Note   I have reviewed the triage vital signs and the nursing notes.   HISTORY  Chief Complaint Headache   HPI Bruce French is a 85 y.o. male presents to the ED with HA and neck discomfort starting last night. Patient denies injury. Pain is worse along the left lateral neck and worse with turning his head. He denies any fever, chills, rash, or photophobia. He takes opiates for chronic pain managed by his PCP without relief. He denies any jaw claudication symptoms or vision changes. No other modifying factors.   Past Medical History:  Diagnosis Date   Alzheimer disease ([place])    Blurry vision, right eye    Cataract extraction status of right eye    Cataract, right eye    Coronary artery disease    Enlarged prostate    Glaucoma    Heart block    s/p PPM INSERT   Hyperlipidemia    Hypertension    Stroke [facility])     Patient Active Problem List   Diagnosis Date Noted   Perforated duodenal ulcer ([place]) 11/22/2015   Perforated ulcer of intestine ([place]) 11/22/2015   Sepsis ([place]) 11/22/2015   Emphysema lung ([place]) 11/22/2015   Coronary artery disease 11/22/2015   S/P placement of cardiac pacemaker 04/05/2013   HTN (hypertension) 01/04/2013   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/2013   COPD (chronic obstructive pulmonary disease) ([place]) 01/04/2013   Dementia ([place]) 01/04/2013   CKD (chronic kidney disease) 01/04/2013   Diabetes type 2, controlled ([place]) 01/04/2013   Hyperlipidemia 01/04/2013   Depressive disorder 01/04/2013   Emphysema 01/04/2013   Cerebrovascular accident (stroke) ([place]) 01/04/2013    Past Surgical History:  Procedure Laterality Date   INSERT / REPLACE / REMOVE PACEMAKER  04/13/04   INSERT PPM [employer]/Model Number:  [id number]     PPM Serial Number:  [id number]   PERMANENT PACEMAKER GENERATOR CHANGE N/A 01/07/2013   Procedure: PERMANENT PACEMAKER GENERATOR CHANGE;  Surgeon: Evans Lance,  MD;  Location: [facility] CATH LAB;  Service: Cardiovascular;  Laterality: N/A;    Allergies Xanax xr [alprazolam er]  History reviewed. No pertinent family history.  Social History Social History   Tobacco Use   Smoking status: Current Every Day Smoker    Packs/day: 1.00    Years: 80.00    Pack years: 80.00    Types: Cigarettes   Smokeless tobacco: Never Used  Substance Use Topics   Alcohol use: No   Drug use: No    Review of Systems  Constitutional: No fever/chills Eyes: No visual changes. ENT: No sore throat. Cardiovascular: Denies chest pain. Respiratory: Denies shortness of breath. Gastrointestinal: No abdominal pain.  No nausea, no vomiting.  No diarrhea.  No constipation. Genitourinary: Negative for dysuria. Musculoskeletal: Negative for back pain. Positive neck pain.  Skin: Negative for rash. Neurological: Negative for  focal weakness or numbness. Positive HA.   10-point ROS otherwise negative.  ____________________________________________   PHYSICAL EXAM:  VITAL SIGNS: ED Triage Vitals  Enc Vitals Group     BP 04/13/19 1114 (!) 159/68     Pulse Rate 04/13/19 1114 89     Resp 04/13/19 1114 20     Temp 04/13/19 1114 98.5 F (36.9 C)     Temp Source 04/13/19 1114 Oral     SpO2 04/13/19 1114 100 %     Weight 04/13/19 1115 121 lb (54.9 kg)     Height 04/13/19 1115 5\' 8"  (1.727 m)    Constitutional:  Alert and oriented. Well appearing and in no acute distress. Eyes: Conjunctivae are normal. PERRL.  Head: Atraumatic. No temporal artery tenderness to touch.  Nose: No congestion/rhinnorhea. Mouth/Throat: Mucous membranes are moist.  Oropharynx non-erythematous. Neck: No stridor.  No meningeal signs. No cervical spine tenderness to palpation along the midline. Tenderness along the left trapezius from the left later neck to the shoulder.  Cardiovascular: Normal rate, regular rhythm. Good peripheral circulation. Grossly normal heart sounds.   Respiratory:  Normal respiratory effort.  No retractions. Lungs CTAB. Gastrointestinal:  No distention.  Musculoskeletal: No gross deformities of extremities. Neurologic:  Normal speech and language. No gross focal neurologic deficits are appreciated.  Skin:  Skin is warm, dry and intact. No rash noted.  ____________________________________________   LABS (all labs ordered are listed, but only abnormal results are displayed)  Labs Reviewed  BASIC METABOLIC PANEL - Abnormal; Notable for the following components:      Result Value   Glucose, Bld 110 (*)    GFR calc non Af Amer 53 (*)    All other components within normal limits  CBC WITH DIFFERENTIAL/PLATELET - Abnormal; Notable for the following components:   WBC 10.9 (*)    Neutro Abs 9.3 (*)    All other components within normal limits   ____________________________________________  RADIOLOGY  Ct Head Wo Contrast  Result Date: 04/13/2019 CLINICAL DATA:  Headache radiating into the neck and bilateral shoulder since last night. No reported injury. EXAM: CT HEAD WITHOUT CONTRAST CT CERVICAL SPINE WITHOUT CONTRAST TECHNIQUE: Multidetector CT imaging of the head and cervical spine was performed following the standard protocol without intravenous contrast. Multiplanar CT image reconstructions of the cervical spine were also generated. COMPARISON:  03/02/2018 head and cervical spine CT. FINDINGS: CT HEAD FINDINGS Brain: Chronic superior right frontoparietal encephalomalacia. No evidence of parenchymal hemorrhage or extra-axial fluid collection. No mass lesion, mass effect, or midline shift. No CT evidence of acute infarction. Generalized cerebral volume loss. Nonspecific mild subcortical and periventricular white matter hypodensity, most in keeping with chronic small vessel ischemic change. Cerebral ventricle sizes are stable and concordant with the degree of cerebral volume loss. Vascular: No acute abnormality. Skull: No evidence of calvarial fracture.  Sinuses/Orbits: The visualized paranasal sinuses are essentially clear. Other: Chronic bilateral mastoid effusions, right greater than left. CT CERVICAL SPINE FINDINGS Alignment: Straightening of the cervical spine. No facet subluxation. Dens is well positioned between the lateral masses of C1. Skull base and vertebrae: No acute fracture. No primary bone lesion or focal pathologic process. Soft tissues and spinal canal: No prevertebral edema. No visible canal hematoma. Disc levels: Moderate-to-marked multilevel cervical degenerative disc disease, most prominent at C6-7. Advanced bilateral facet arthropathy. Mild degenerative foraminal stenosis bilaterally at C3-4 and on the left at C4-5, on the right at C5-6 and bilaterally at C6-7 predominantly due to uncovertebral hypertrophy. Upper chest: Centrilobular and paraseptal emphysema both lung apices. Other: Chronic bilateral mastoid effusions. No discrete thyroid nodules. No pathologically enlarged cervical nodes. Chronic coarse calcifications throughout the parotid glands bilaterally. IMPRESSION: 1.  No evidence of acute intracranial abnormality. 2. Chronic right superior frontal parietal encephalomalacia. 3. Generalized cerebral volume loss and chronic mild small vessel ischemic changes in the cerebral white matter. 4. Chronic bilateral mastoid effusions, right greater than left. 5. No acute osseous abnormality in the cervical spine. 6. Moderate multilevel degenerative changes in the cervical spine as detailed. 7.  Emphysema (ICD10-J43.9). Electronically Signed   By: Ilona Sorrel M.D.   On: 04/13/2019 12:23  Ct Cervical Spine Wo Contrast  Result Date: 04/13/2019 CLINICAL DATA:  Headache radiating into the neck and bilateral shoulder since last night. No reported injury. EXAM: CT HEAD WITHOUT CONTRAST CT CERVICAL SPINE WITHOUT CONTRAST TECHNIQUE: Multidetector CT imaging of the head and cervical spine was performed following the standard protocol without  intravenous contrast. Multiplanar CT image reconstructions of the cervical spine were also generated. COMPARISON:  03/02/2018 head and cervical spine CT. FINDINGS: CT HEAD FINDINGS Brain: Chronic superior right frontoparietal encephalomalacia. No evidence of parenchymal hemorrhage or extra-axial fluid collection. No mass lesion, mass effect, or midline shift. No CT evidence of acute infarction. Generalized cerebral volume loss. Nonspecific mild subcortical and periventricular white matter hypodensity, most in keeping with chronic small vessel ischemic change. Cerebral ventricle sizes are stable and concordant with the degree of cerebral volume loss. Vascular: No acute abnormality. Skull: No evidence of calvarial fracture. Sinuses/Orbits: The visualized paranasal sinuses are essentially clear. Other: Chronic bilateral mastoid effusions, right greater than left. CT CERVICAL SPINE FINDINGS Alignment: Straightening of the cervical spine. No facet subluxation. Dens is well positioned between the lateral masses of C1. Skull base and vertebrae: No acute fracture. No primary bone lesion or focal pathologic process. Soft tissues and spinal canal: No prevertebral edema. No visible canal hematoma. Disc levels: Moderate-to-marked multilevel cervical degenerative disc disease, most prominent at C6-7. Advanced bilateral facet arthropathy. Mild degenerative foraminal stenosis bilaterally at C3-4 and on the left at C4-5, on the right at C5-6 and bilaterally at C6-7 predominantly due to uncovertebral hypertrophy. Upper chest: Centrilobular and paraseptal emphysema both lung apices. Other: Chronic bilateral mastoid effusions. No discrete thyroid nodules. No pathologically enlarged cervical nodes. Chronic coarse calcifications throughout the parotid glands bilaterally. IMPRESSION: 1.  No evidence of acute intracranial abnormality. 2. Chronic right superior frontal parietal encephalomalacia. 3. Generalized cerebral volume loss and  chronic mild small vessel ischemic changes in the cerebral white matter. 4. Chronic bilateral mastoid effusions, right greater than left. 5. No acute osseous abnormality in the cervical spine. 6. Moderate multilevel degenerative changes in the cervical spine as detailed. 7.  Emphysema (ICD10-J43.9). Electronically Signed   By: Ilona Sorrel M.D.   On: 04/13/2019 12:23    ____________________________________________   PROCEDURES  Procedure(s) performed:   Procedures  None ____________________________________________   INITIAL IMPRESSION / ASSESSMENT AND PLAN / ED COURSE  Pertinent labs & imaging results that were available during my care of the patient were reviewed by me and considered in my medical decision making (see chart for details).   Patient with HA and neck pain since yesterday. No concern clinically for meningitis or giant cell arteritis. No SAH concern clinically. Exam is very MSK suggestive with focal tenderness along the musculature. CT imaging reviewed with no acute findings. Labs unremarkable without sign of infection. Patient on chronic, high dose opiates so cannot add msk relaxers. Plan for topical medication and close f/u with PCP should symptoms continue.    ____________________________________________  FINAL CLINICAL IMPRESSION(S) / ED DIAGNOSES  Final diagnoses:  Acute strain of neck muscle, initial encounter  Acute non intractable tension-type headache     MEDICATIONS GIVEN DURING THIS VISIT:  Medications  morphine 4 MG/ML injection 4 mg (4 mg Intravenous Given 04/13/19 1206)     NEW OUTPATIENT MEDICATIONS STARTED DURING THIS VISIT:  Discharge Medication List as of 04/13/2019 12:59 PM    START taking these medications   Details  diclofenac sodium (VOLTAREN) 1 % GEL Apply 2 g topically 4 (four) times daily as  needed (pain)., Starting Tue 04/13/2019, Normal    lidocaine (XYLOCAINE) 5 % ointment Apply 1 application topically 3 (three) times daily as  needed., Starting Tue 04/13/2019, Normal        Note:  This document was prepared using Dragon voice recognition software and may include unintentional dictation errors.  Nanda Quinton, MD, [facility] Emergency Medicine    Frimy Uffelman, [place], MD 04/13/19 [phone]

## 2019-04-14 DIAGNOSIS — M47812 Spondylosis without myelopathy or radiculopathy, cervical region: Secondary | ICD-10-CM | POA: Diagnosis not present

## 2019-04-14 DIAGNOSIS — M62838 Other muscle spasm: Secondary | ICD-10-CM | POA: Diagnosis not present

## 2019-04-28 ENCOUNTER — Emergency Department (HOSPITAL_COMMUNITY): Payer: Medicare Other

## 2019-04-28 ENCOUNTER — Inpatient Hospital Stay (HOSPITAL_COMMUNITY)
Admission: EM | Admit: 2019-04-28 | Discharge: 2019-04-30 | DRG: 543 | Disposition: A | Discharge status: Hospice | Payer: Medicare Other | Attending: Internal Medicine | Admitting: Internal Medicine

## 2019-04-28 ENCOUNTER — Inpatient Hospital Stay (HOSPITAL_COMMUNITY): Payer: Medicare Other

## 2019-04-28 ENCOUNTER — Encounter (HOSPITAL_COMMUNITY): Payer: Self-pay | Admitting: Emergency Medicine

## 2019-04-28 ENCOUNTER — Other Ambulatory Visit: Payer: Self-pay

## 2019-04-28 DIAGNOSIS — H269 Unspecified cataract: Secondary | ICD-10-CM | POA: Diagnosis present

## 2019-04-28 DIAGNOSIS — E785 Hyperlipidemia, unspecified: Secondary | ICD-10-CM | POA: Diagnosis present

## 2019-04-28 DIAGNOSIS — D72829 Elevated white blood cell count, unspecified: Secondary | ICD-10-CM | POA: Diagnosis present

## 2019-04-28 DIAGNOSIS — R150 Incomplete defecation: Secondary | ICD-10-CM | POA: Diagnosis not present

## 2019-04-28 DIAGNOSIS — Z515 Encounter for palliative care: Secondary | ICD-10-CM

## 2019-04-28 DIAGNOSIS — S32010A Wedge compression fracture of first lumbar vertebra, initial encounter for closed fracture: Secondary | ICD-10-CM | POA: Diagnosis not present

## 2019-04-28 DIAGNOSIS — M545 Low back pain, unspecified: Secondary | ICD-10-CM | POA: Diagnosis present

## 2019-04-28 DIAGNOSIS — Z950 Presence of cardiac pacemaker: Secondary | ICD-10-CM

## 2019-04-28 DIAGNOSIS — Z888 Allergy status to other drugs, medicaments and biological substances status: Secondary | ICD-10-CM

## 2019-04-28 DIAGNOSIS — R64 Cachexia: Secondary | ICD-10-CM | POA: Diagnosis present

## 2019-04-28 DIAGNOSIS — R159 Full incontinence of feces: Secondary | ICD-10-CM | POA: Diagnosis present

## 2019-04-28 DIAGNOSIS — N400 Enlarged prostate without lower urinary tract symptoms: Secondary | ICD-10-CM | POA: Diagnosis present

## 2019-04-28 DIAGNOSIS — I10 Essential (primary) hypertension: Secondary | ICD-10-CM | POA: Diagnosis present

## 2019-04-28 DIAGNOSIS — M5136 Other intervertebral disc degeneration, lumbar region: Secondary | ICD-10-CM | POA: Diagnosis present

## 2019-04-28 DIAGNOSIS — R296 Repeated falls: Secondary | ICD-10-CM | POA: Diagnosis present

## 2019-04-28 DIAGNOSIS — K5641 Fecal impaction: Secondary | ICD-10-CM | POA: Diagnosis present

## 2019-04-28 DIAGNOSIS — Z8673 Personal history of transient ischemic attack (TIA), and cerebral infarction without residual deficits: Secondary | ICD-10-CM

## 2019-04-28 DIAGNOSIS — M4855XA Collapsed vertebra, not elsewhere classified, thoracolumbar region, initial encounter for fracture: Secondary | ICD-10-CM | POA: Diagnosis not present

## 2019-04-28 DIAGNOSIS — E119 Type 2 diabetes mellitus without complications: Secondary | ICD-10-CM | POA: Diagnosis present

## 2019-04-28 DIAGNOSIS — W19XXXA Unspecified fall, initial encounter: Secondary | ICD-10-CM

## 2019-04-28 DIAGNOSIS — Z03818 Encounter for observation for suspected exposure to other biological agents ruled out: Secondary | ICD-10-CM | POA: Diagnosis not present

## 2019-04-28 DIAGNOSIS — Z79899 Other long term (current) drug therapy: Secondary | ICD-10-CM

## 2019-04-28 DIAGNOSIS — J410 Simple chronic bronchitis: Secondary | ICD-10-CM | POA: Diagnosis present

## 2019-04-28 DIAGNOSIS — Z681 Body mass index (BMI) 19 or less, adult: Secondary | ICD-10-CM | POA: Diagnosis not present

## 2019-04-28 DIAGNOSIS — K8020 Calculus of gallbladder without cholecystitis without obstruction: Secondary | ICD-10-CM | POA: Diagnosis not present

## 2019-04-28 DIAGNOSIS — J449 Chronic obstructive pulmonary disease, unspecified: Secondary | ICD-10-CM | POA: Diagnosis not present

## 2019-04-28 DIAGNOSIS — Z79891 Long term (current) use of opiate analgesic: Secondary | ICD-10-CM

## 2019-04-28 DIAGNOSIS — Z20828 Contact with and (suspected) exposure to other viral communicable diseases: Secondary | ICD-10-CM | POA: Diagnosis present

## 2019-04-28 DIAGNOSIS — R7989 Other specified abnormal findings of blood chemistry: Secondary | ICD-10-CM | POA: Diagnosis present

## 2019-04-28 DIAGNOSIS — Z7189 Other specified counseling: Secondary | ICD-10-CM | POA: Diagnosis not present

## 2019-04-28 DIAGNOSIS — K5909 Other constipation: Secondary | ICD-10-CM

## 2019-04-28 DIAGNOSIS — F0280 Dementia in other diseases classified elsewhere without behavioral disturbance: Secondary | ICD-10-CM | POA: Diagnosis present

## 2019-04-28 DIAGNOSIS — I2510 Atherosclerotic heart disease of native coronary artery without angina pectoris: Secondary | ICD-10-CM | POA: Diagnosis present

## 2019-04-28 DIAGNOSIS — Z66 Do not resuscitate: Secondary | ICD-10-CM | POA: Diagnosis present

## 2019-04-28 DIAGNOSIS — J439 Emphysema, unspecified: Secondary | ICD-10-CM | POA: Diagnosis not present

## 2019-04-28 DIAGNOSIS — H409 Unspecified glaucoma: Secondary | ICD-10-CM | POA: Diagnosis present

## 2019-04-28 DIAGNOSIS — M48061 Spinal stenosis, lumbar region without neurogenic claudication: Secondary | ICD-10-CM | POA: Diagnosis present

## 2019-04-28 DIAGNOSIS — G311 Senile degeneration of brain, not elsewhere classified: Secondary | ICD-10-CM | POA: Diagnosis not present

## 2019-04-28 DIAGNOSIS — G8929 Other chronic pain: Secondary | ICD-10-CM | POA: Diagnosis present

## 2019-04-28 DIAGNOSIS — Y92002 Bathroom of unspecified non-institutional (private) residence single-family (private) house as the place of occurrence of the external cause: Secondary | ICD-10-CM | POA: Diagnosis not present

## 2019-04-28 DIAGNOSIS — I714 Abdominal aortic aneurysm, without rupture: Secondary | ICD-10-CM | POA: Diagnosis present

## 2019-04-28 DIAGNOSIS — G309 Alzheimer's disease, unspecified: Secondary | ICD-10-CM | POA: Diagnosis present

## 2019-04-28 DIAGNOSIS — W01198A Fall on same level from slipping, tripping and stumbling with subsequent striking against other object, initial encounter: Secondary | ICD-10-CM | POA: Diagnosis present

## 2019-04-28 DIAGNOSIS — F0390 Unspecified dementia without behavioral disturbance: Secondary | ICD-10-CM

## 2019-04-28 DIAGNOSIS — F17210 Nicotine dependence, cigarettes, uncomplicated: Secondary | ICD-10-CM | POA: Diagnosis present

## 2019-04-28 DIAGNOSIS — N289 Disorder of kidney and ureter, unspecified: Secondary | ICD-10-CM | POA: Diagnosis present

## 2019-04-28 LAB — CBC WITH DIFFERENTIAL/PLATELET
Abs Immature Granulocytes: 0.1 10*3/uL — ABNORMAL HIGH (ref 0.00–0.07)
Basophils Absolute: 0 10*3/uL (ref 0.0–0.1)
Basophils Relative: 0 %
Eosinophils Absolute: 0 10*3/uL (ref 0.0–0.5)
Eosinophils Relative: 0 %
HCT: 36.7 % — ABNORMAL LOW (ref 39.0–52.0)
Hemoglobin: 11.4 g/dL — ABNORMAL LOW (ref 13.0–17.0)
Immature Granulocytes: 1 %
Lymphocytes Relative: 7 %
Lymphs Abs: 0.9 10*3/uL (ref 0.7–4.0)
MCH: 27.9 pg (ref 26.0–34.0)
MCHC: 31.1 g/dL (ref 30.0–36.0)
MCV: 90 fL (ref 80.0–100.0)
Monocytes Absolute: 0.8 10*3/uL (ref 0.1–1.0)
Monocytes Relative: 6 %
Neutro Abs: 12.3 10*3/uL — ABNORMAL HIGH (ref 1.7–7.7)
Neutrophils Relative %: 86 %
Platelets: 318 10*3/uL (ref 150–400)
RBC: 4.08 MIL/uL — ABNORMAL LOW (ref 4.22–5.81)
RDW: 14.4 % (ref 11.5–15.5)
WBC: 14.2 10*3/uL — ABNORMAL HIGH (ref 4.0–10.5)
nRBC: 0 % (ref 0.0–0.2)

## 2019-04-28 LAB — BASIC METABOLIC PANEL
Anion gap: 12 (ref 5–15)
BUN: 40 mg/dL — ABNORMAL HIGH (ref 8–23)
CO2: 29 mmol/L (ref 22–32)
Calcium: 9 mg/dL (ref 8.9–10.3)
Chloride: 99 mmol/L (ref 98–111)
Creatinine, Ser: 1.3 mg/dL — ABNORMAL HIGH (ref 0.61–1.24)
GFR calc Af Amer: 55 mL/min — ABNORMAL LOW (ref >60)
GFR calc non Af Amer: 48 mL/min — ABNORMAL LOW (ref >60)
Glucose, Bld: 159 mg/dL — ABNORMAL HIGH (ref 70–99)
Potassium: 4.9 mmol/L (ref 3.5–5.1)
Sodium: 140 mmol/L (ref 135–145)

## 2019-04-28 LAB — URINALYSIS, ROUTINE W REFLEX MICROSCOPIC
Bilirubin Urine: NEGATIVE
Glucose, UA: NEGATIVE mg/dL
Hgb urine dipstick: NEGATIVE
Ketones, ur: NEGATIVE mg/dL
Leukocytes,Ua: NEGATIVE
Nitrite: NEGATIVE
Protein, ur: 30 mg/dL — AB
Specific Gravity, Urine: 1.017 (ref 1.005–1.030)
pH: 5 (ref 5.0–8.0)

## 2019-04-28 MED ORDER — LEVOCETIRIZINE DIHYDROCHLORIDE 5 MG PO TABS: 5.0000 mg | ORAL_TABLET | Freq: Every day | ORAL | Status: DC

## 2019-04-28 MED ORDER — SODIUM CHLORIDE 0.9% FLUSH
3.0000 mL | Freq: Two times a day (BID) | INTRAVENOUS | Status: DC
  Administered 2019-04-29 (×3): 3 mL via INTRAVENOUS

## 2019-04-28 MED ORDER — VITAMIN D 25 MCG (1000 UNIT) PO TABS
5000.0000 [IU] | ORAL_TABLET | Freq: Every day | ORAL | Status: DC
  Administered 2019-04-29 – 2019-04-30 (×2): 5000 [IU] via ORAL
  Filled 2019-04-28 (×2): qty 5

## 2019-04-28 MED ORDER — LORATADINE 10 MG PO TABS
10.0000 mg | ORAL_TABLET | Freq: Every day | ORAL | Status: DC
  Administered 2019-04-29 – 2019-04-30 (×2): 10 mg via ORAL
  Filled 2019-04-28 (×2): qty 1

## 2019-04-28 MED ORDER — HYDROMORPHONE HCL 1 MG/ML IJ SOLN
0.5000 mg | Freq: Once | INTRAMUSCULAR | Status: AC
  Administered 2019-04-28: 0.5 mg via INTRAVENOUS
  Filled 2019-04-28: qty 1

## 2019-04-28 MED ORDER — POLYETHYLENE GLYCOL 3350 17 G PO PACK: 17.0000 g | PACK | Freq: Every day | ORAL | Status: DC | PRN

## 2019-04-28 MED ORDER — ADULT MULTIVITAMIN W/MINERALS CH
1.0000 | ORAL_TABLET | Freq: Every day | ORAL | Status: DC
  Administered 2019-04-29 – 2019-04-30 (×2): 1 via ORAL
  Filled 2019-04-28 (×2): qty 1

## 2019-04-28 MED ORDER — DUTASTERIDE 0.5 MG PO CAPS
0.5000 mg | ORAL_CAPSULE | Freq: Every day | ORAL | Status: DC
  Administered 2019-04-29: 0.5 mg via ORAL
  Filled 2019-04-28 (×5): qty 1

## 2019-04-28 MED ORDER — SODIUM CHLORIDE 0.9 % IV BOLUS: 500.0000 mL | Freq: Once | INTRAVENOUS | Status: DC

## 2019-04-28 MED ORDER — ACETAMINOPHEN 325 MG PO TABS: 650.0000 mg | ORAL_TABLET | Freq: Four times a day (QID) | ORAL | Status: DC | PRN

## 2019-04-28 MED ORDER — HYDROMORPHONE HCL 1 MG/ML IJ SOLN
0.5000 mg | INTRAMUSCULAR | Status: DC | PRN
  Administered 2019-04-29: 0.5 mg via INTRAVENOUS
  Filled 2019-04-28 (×2): qty 0.5

## 2019-04-28 MED ORDER — NICOTINE 21 MG/24HR TD PT24: 21.0000 mg | MEDICATED_PATCH | Freq: Every day | TRANSDERMAL | Status: DC | PRN

## 2019-04-28 MED ORDER — DONEPEZIL HCL 5 MG PO TABS
10.0000 mg | ORAL_TABLET | Freq: Every day | ORAL | Status: DC
  Administered 2019-04-29 – 2019-04-30 (×2): 10 mg via ORAL
  Filled 2019-04-28 (×2): qty 2

## 2019-04-28 MED ORDER — ONDANSETRON HCL 4 MG/2ML IJ SOLN
4.0000 mg | Freq: Once | INTRAMUSCULAR | Status: AC
  Administered 2019-04-28: 4 mg via INTRAVENOUS
  Filled 2019-04-28: qty 2

## 2019-04-28 MED ORDER — ATORVASTATIN CALCIUM 20 MG PO TABS
20.0000 mg | ORAL_TABLET | Freq: Every day | ORAL | Status: DC
  Administered 2019-04-29 – 2019-04-30 (×2): 20 mg via ORAL
  Filled 2019-04-28 (×2): qty 1

## 2019-04-28 MED ORDER — VITAMIN C 500 MG PO TABS
1000.0000 mg | ORAL_TABLET | Freq: Every day | ORAL | Status: DC
  Administered 2019-04-29 – 2019-04-30 (×2): 1000 mg via ORAL
  Filled 2019-04-28 (×2): qty 2

## 2019-04-28 MED ORDER — SODIUM CHLORIDE 0.9 % IV BOLUS
1000.0000 mL | Freq: Once | INTRAVENOUS | Status: AC
  Administered 2019-04-28: 1000 mL via INTRAVENOUS

## 2019-04-28 MED ORDER — ACETAMINOPHEN 650 MG RE SUPP: 650.0000 mg | Freq: Four times a day (QID) | RECTAL | Status: DC | PRN

## 2019-04-28 MED ORDER — DOCUSATE SODIUM 100 MG PO CAPS
100.0000 mg | ORAL_CAPSULE | Freq: Two times a day (BID) | ORAL | Status: DC
  Administered 2019-04-29 – 2019-04-30 (×3): 100 mg via ORAL
  Filled 2019-04-28 (×3): qty 1

## 2019-04-28 NOTE — ED Provider Notes (Signed)
[facility] EMERGENCY DEPARTMENT Provider Note   CSN: [id number] Arrival date & time: 04/28/19  1112     History   Chief Complaint Chief Complaint  Patient presents with  . Fall    HPI Bruce French is a 85 y.o. male with a history of alzheimers disease,PUD with perforation in 2017, CAD, htn, abdominal aneurysm with aorto bi-iliac grafting,  cva, DM and COPD with chronic low back pain presenting with severe pain in his lower back along with difficulty stooling since yesterday. He reports feeling his stool "drop" but is unable to push it out. He denies pain or fullness at his rectum.  Dg at bedside is his primary caregiver.  She reports he was told he is not a surgical candidate per prior evaluation of his chronic low back pain.  He has fallen several times since his last visit here on 11/3, most recently 2 morning ago when he lost his balance trying to stand up from the commode. He landed with his lower back against the toilet paper holder.  His pcp currently has him on MS contin 30 mg q 12 hours and oxycodone 10 mg every 6 hours which his dg states does not controlling  his pain.  He has had no fevers, denies headache or head injury from his recent falls.  He also has had poor PO intake stating his pain is too bad to eat.   Denies chest pain, dizziness, sob, abdominal pain, no n/v/ dysuria. Also denies numbness or increased weakness in his legs but endorses generalized weakness.     The history is provided by the patient and a relative (daughter at bedside).    Past Medical History:  Diagnosis Date  . Alzheimer disease ([place])   . Blurry vision, right eye   . Cataract extraction status of right eye   . Cataract, right eye   . Coronary artery disease   . Enlarged prostate   . Glaucoma   . Heart block    s/p PPM INSERT  . Hyperlipidemia   . Hypertension   . Stroke [facility])     Patient Active Problem List   Diagnosis Date Noted  . Perforated duodenal ulcer ([place]) 11/22/2015  .  Perforated ulcer of intestine ([place]) 11/22/2015  . Sepsis ([place]) 11/22/2015  . Emphysema lung ([place]) 11/22/2015  . Coronary artery disease 11/22/2015  . S/P placement of cardiac pacemaker 04/05/2013  . HTN (hypertension) 01/04/2013  . 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 COPD (chronic obstructive pulmonary disease) ([place]) 01/04/2013  . Dementia ([place]) 01/04/2013  . CKD (chronic kidney disease) 01/04/2013  . Diabetes type 2, controlled ([place]) 01/04/2013  . Hyperlipidemia 01/04/2013  . Depressive disorder 01/04/2013  . Emphysema 01/04/2013  . Cerebrovascular accident (stroke) ([place]) 01/04/2013    Past Surgical History:  Procedure Laterality Date  . INSERT / REPLACE / REMOVE PACEMAKER  04/13/04   INSERT PPM MEDTRONIC/Model Number:  [id number]     PPM Serial Number:  [id number]  . PERMANENT PACEMAKER GENERATOR CHANGE N/A 01/07/2013   Procedure: PERMANENT PACEMAKER GENERATOR CHANGE;  Surgeon: Evans Lance, MD;  Location: [facility] CATH LAB;  Service: Cardiovascular;  Laterality: N/A;        Home Medications    Prior to Admission medications   Medication Sig Start Date End Date Taking? Authorizing Provider  atorvastatin (LIPITOR) 20 MG tablet Take 20 mg by mouth daily.   Yes [provider]  donepezil (ARICEPT) 10 MG tablet Take 10 mg by mouth daily.  Yes [provider]  dutasteride (AVODART) 0.5 MG capsule Take 0.5 mg by mouth daily.   Yes [provider]  levocetirizine (XYZAL) 5 MG tablet Take 5 mg by mouth daily. 03/30/19  Yes [provider]  morphine (MS CONTIN) 15 MG 12 hr tablet Take 15 mg by mouth every 12 (twelve) hours.   Yes [provider]  morphine (MS CONTIN) 30 MG 12 hr tablet Take 30 mg by mouth 2 (two) times daily. 04/06/19  Yes [provider]  Oxycodone HCl 10 MG TABS Take 10 mg by mouth every 6 (six) hours.  03/30/19  Yes [provider]  Ascorbic Acid (VITAMIN C) 1000 MG tablet Take 1,000 mg by  mouth daily.    [provider]  Cholecalciferol (VITAMIN D-3) 5000 UNITS TABS Take 5,000 Units by mouth daily.    [provider]  cyclobenzaprine (FLEXERIL) 5 MG tablet Take 5 mg by mouth every 8 (eight) hours as needed for muscle spasms.  04/14/19   [provider]  diclofenac sodium (VOLTAREN) 1 % GEL Apply 2 g topically 4 (four) times daily as needed (pain). 04/13/19   Long, Wonda Olds, MD  furosemide (LASIX) 40 MG tablet Take 40 mg by mouth daily. 11/10/18   [provider]  lidocaine (XYLOCAINE) 5 % ointment Apply 1 application topically 3 (three) times daily as needed. 04/13/19   Long, Wonda Olds, MD  Multiple Vitamin (MULTIVITAMIN WITH MINERALS) TABS Take 1 tablet by mouth daily.    [provider]  pantoprazole (PROTONIX) 40 MG tablet Take 1 tablet (40 mg total) by mouth 2 (two) times daily. Patient taking differently: Take 40 mg by mouth daily.  11/27/15   Isaac Bliss, Rayford Halsted, MD  valsartan-hydrochlorothiazide (DIOVAN-HCT) 160-12.5 MG per tablet Take 1 tablet by mouth daily.    [provider]    Family History History reviewed. No pertinent family history.  Social History Social History   Tobacco Use  . Smoking status: Current Every Day Smoker    Packs/day: 1.00    Years: 80.00    Pack years: 80.00    Types: Cigarettes  . Smokeless tobacco: Never Used  Substance Use Topics  . Alcohol use: No  . Drug use: No     Allergies   Xanax xr [alprazolam er]   Review of Systems Review of Systems  Constitutional: Positive for appetite change. Negative for chills and fever.  HENT: Negative for congestion and sore throat.   Eyes: Negative.   Respiratory: Negative for chest tightness and shortness of breath.   Cardiovascular: Negative for chest pain.  Gastrointestinal: Negative for abdominal pain, nausea, rectal pain and vomiting.  Genitourinary: Negative.   Musculoskeletal: Positive for back pain. Negative for arthralgias,  joint swelling and neck pain.  Skin: Negative.  Negative for rash and wound.  Neurological: Negative for dizziness, weakness, light-headedness, numbness and headaches.  Psychiatric/Behavioral: Negative.      Physical Exam Updated Vital Signs BP (!) 135/56   Pulse 75   Temp 98 F (36.7 C) (Oral)   Resp 16   Ht 5\' 8"  (1.727 m)   Wt 54.2 kg   SpO2 100%   BMI 18.17 kg/m   Physical Exam Vitals signs and nursing note reviewed.  Constitutional:      Appearance: He is well-developed. He is ill-appearing.     Comments: Appears chronically ill, cachectic.   HENT:     Head: Normocephalic and atraumatic.     Mouth/Throat:  Mouth: Mucous membranes are dry.  Eyes:     Conjunctiva/sclera: Conjunctivae normal.  Neck:     Musculoskeletal: Normal range of motion.  Cardiovascular:     Rate and Rhythm: Normal rate and regular rhythm.     Heart sounds: Normal heart sounds.  Pulmonary:     Effort: Pulmonary effort is normal.     Breath sounds: Normal breath sounds. No wheezing.  Abdominal:     General: Abdomen is scaphoid. Bowel sounds are normal.     Palpations: Abdomen is soft.     Tenderness: There is no abdominal tenderness.  Genitourinary:    Rectum: Abnormal anal tone.     Comments: Pt with reduced rectal tone. Soft stool in rectum, he has soft bm in his depends.  Musculoskeletal: Normal range of motion.     Lumbar back: He exhibits bony tenderness. He exhibits no swelling, no edema and no deformity.  Skin:    General: Skin is warm and dry.  Neurological:     Mental Status: He is alert.     Sensory: Sensation is intact.     Comments: Pt can flex/ext feet and knees, unwilling to sit secondary to pain.       ED Treatments / Results  Labs (all labs ordered are listed, but only abnormal results are displayed) Labs Reviewed  CBC WITH DIFFERENTIAL/PLATELET - Abnormal; Notable for the following components:      Result Value   WBC 14.2 (*)    RBC 4.08 (*)    Hemoglobin  11.4 (*)    HCT 36.7 (*)    Neutro Abs 12.3 (*)    Abs Immature Granulocytes 0.10 (*)    All other components within normal limits  BASIC METABOLIC PANEL - Abnormal; Notable for the following components:   Glucose, Bld 159 (*)    BUN 40 (*)    Creatinine, Ser 1.30 (*)    GFR calc non Af Amer 48 (*)    GFR calc Af Amer 55 (*)    All other components within normal limits  URINALYSIS, ROUTINE W REFLEX MICROSCOPIC - Abnormal; Notable for the following components:   APPearance HAZY (*)    Protein, ur 30 (*)    Bacteria, UA RARE (*)    All other components within normal limits  SARS CORONAVIRUS 2 (TAT 6-24 HRS)    EKG None  Radiology Dg Chest 1 View  Result Date: 04/28/2019 CLINICAL DATA:  Multiple falls over the past 3 days, initial encounter EXAM: CHEST  1 VIEW COMPARISON:  12/31/2018 FINDINGS: Cardiac shadow is mildly prominent but stable. Pacing device is again seen. Aortic calcifications are again noted. Patient is rotated to the left accentuating the mediastinal markings. The lungs are hyperinflated without focal infiltrate. Persistent soft tissue density to the right of the trachea is seen. This is related to prominent vascularity and stable from the prior study. No bony abnormality is seen. IMPRESSION: No acute abnormality noted. Electronically Signed   By: Inez Catalina M.D.   On: 04/28/2019 14:24   Dg Lumbar Spine Complete  Result Date: 04/28/2019 CLINICAL DATA:  85 year old male with fall and back pain. EXAM: LUMBAR SPINE - COMPLETE 4+ VIEW COMPARISON:  Lumbar spine radiograph dated 12/31/2018. FINDINGS: There is no acute fracture or subluxation of the lumbar spine. There is straightening of normal lumbar lordosis which may be positional or due to muscle spasm or secondary to degenerative changes. There is osteopenia with mild old-appearing compression fracture of the superior endplate of L1  as well as chronic mild compression fracture of T12. There is grade 1 L4-L5  retrolisthesis. Disc desiccation and vacuum phenomena at L5-S1. Multilevel degenerative changes with osteophyte. Atherosclerotic calcification of the aorta. Several radiopaque foci in the region of the flanks bilaterally. There is moderate colonic stool burden with apparent colonic diverticula. IMPRESSION: 1. No acute fracture or subluxation of the lumbar spine. 2. Osteopenia with multilevel degenerative changes and mild old compression fractures of T12 and L1. 3. Grade 1 L4-L5 retrolisthesis. Electronically Signed   By: Anner Crete M.D.   On: 04/28/2019 14:22   Dg Hips Bilat W Or Wo Pelvis 3-4 Views  Result Date: 04/28/2019 CLINICAL DATA:  Recent falls over the past 3 days with hip pain, initial encounter EXAM: DG HIP (WITH OR WITHOUT PELVIS) 4V BILAT COMPARISON:  None. FINDINGS: Pelvic ring is intact. Degenerative changes of the hip joints are noted bilaterally. No acute fracture or dislocation is noted. No soft tissue abnormality is noted. Degenerative changes in the lumbar spine are noted. IMPRESSION: Degenerative changes without acute abnormality. Electronically Signed   By: Inez Catalina M.D.   On: 04/28/2019 14:23    Procedures Procedures (including critical care time)  Medications Ordered in ED Medications  HYDROmorphone (DILAUDID) injection 0.5 mg (0.5 mg Intravenous Given 04/28/19 1331)  ondansetron (ZOFRAN) injection 4 mg (4 mg Intravenous Given 04/28/19 1332)  sodium chloride 0.9 % bolus 1,000 mL (0 mLs Intravenous Stopped 04/28/19 1416)     Initial Impression / Assessment and Plan / ED Course  I have reviewed the triage vital signs and the nursing notes.  Pertinent labs & imaging results that were available during my care of the patient were reviewed by me and considered in my medical decision making (see chart for details).  Clinical Course as of Apr 27 1809  Wed Apr 28, 2019  1553 DG Hips Bilat W or Wo Pelvis 3-4 Views [JI]    Clinical Course User Index [JI] Evalee Jefferson, PA-C       6:10 PM At recheck, pt had not yet received dilaudid.  Dg states has chronic smokers cough but has had productive cough the past week.  CXR added.  Pt with chronic low back pain with severe worsened pain over the past 2 days, with stool incontinence.  On chronic pain medication for is low back pain, not a surgical candidate.  Increased falls. Lives with dg primary care giver. Reduced rectal tone on exam.    Pt has a pacemaker, also with renal insufficiency so CT without contract ordered to better define Lumbo/sacral/pelvic injury or other source of increased pain. Recent falls, possible occult fracture.    Discussed with PA Ples Specter with neurosurgery given new stool incontinence- no other imaging modality recommended given limitatations, would consider Ct myelogram if he could tolerate CT contrast but since he cannot, CT without best option.  Discussed with Dr. Glo Herring with [facility] who will admit pt for further evaluation sx and tx of his pain.  Final Clinical Impressions(s) / ED Diagnoses   Final diagnoses:  Low back pain  Midline low back pain without sciatica, unspecified chronicity  Incomplete defecation    ED Discharge Orders    None       Landis Martins 04/28/19 1810    Ezequiel Essex, MD 04/29/19 [phone]

## 2019-04-28 NOTE — H&P (Signed)
History and Physical    PLEASE NOTE THAT DRAGON DICTATION SOFTWARE WAS USED IN THE CONSTRUCTION OF THIS NOTE.   BOLDEN MEST [id number] DOB: 1926/04/25 DOA: 04/28/2019  PCP: Curlene Labrum, MD Patient coming from: home  I have personally briefly reviewed patient's old medical records in [place]  Chief Complaint: low back pain  HPI: Bruce French is a 85 y.o. male with medical history significant for chronic low back pain, Alzheimer's dementia, high-grade heart block status post pacemaker placement in 2005, who is admitted to [facility] for overnight observation on 04/28/2019 with acute on chronic low back pain after presenting from home to [facility] emergency department complaining of low back pain.   In the setting of the patient's dementia, the following history is obtained via my discussions with the emergency department physician as well as via chart review.   Per the emergency department physician's discussions with the patient's daughter, with whom the patient lives, the daughter conveys that the patient has chronic low back pain in the setting of multiple low back pathologies, although she is unsure as to the specific nature of these chronic low back pathologies.  However, daughter conveys that the patient has been told that he is not a surgical candidate for these chronic low back pathologies, and consequently he has been pursuing the palliative care, as managed by his PCP.  However, over the last several days, patient's low back pain has intensified and was poorly controlled on outpatient regimen of MS Contin 30 mg p.o. twice daily as well as scheduled short acting oxycodone 10 mg p.o. every 6 hours.  The patient was also recently on multiple topical agents, including Voltaren gel as well as lidocaine patch, but he reportedly self discontinued these topical agents due to feeling that they were not beneficial in adequately controlling his discomfort.   The  patient reportedly has not been complaining of any saddle anesthesia.  While he has a documented history of BPH, patient's daughter does not believe that the patient has been experiencing any recent worsening urinary retention relative to baseline.  Additionally, the patient has a history of chronic constipation in the setting of his chronic opioid use, without any recent evidence of fecal incontinence.  In the setting of worsening low back discomfort over the last week, patient's daughter reports that the patient has experienced multiple ground-level mechanical falls over that time, which she relates to be on the basis of poor pain control as opposed to lower extremity weakness.  These falls were witnessed by the daughter, who reports that the patient did not hit his head as a component of any of these falls, nor was there any associated loss of consciousness.  Daughter reportedly reiterated that she is aware that the patient is not a surgical candidate for his low back pathology, but conveys that she is hoping for improved symptomatic control with optimized pain management.  She also confirms patient's code status to be DNR.   ED Course: Vital signs in the emergency department were notable for the following: Temperature max 98.1; heart rate 60-75; blood pressure ranged from 128/60-140 4/54; respiratory rate 15-18, and oxygen saturation 94 to 98% on room air.  Labs in the ED this evening were notable for the following: Sodium 140, creatinine 1.30 relative to most recent prior value of 1.20 when checked on 04/13/2019.  CBC notable for white blood cell count of 14,000 with 86% neutrophils, increased from most recent prior value of 10.9 when  checked on 04/13/2019.  Urinalysis shows no white blood cells and was found to be nitrate negative as well as leukocyte esterase negative.  COVID-19 nasopharyngeal swab was performed in the ED this evening, and result is currently pending.  While in the ED this evening, the  patient underwent extensive imaging evaluation, with notable results as follows: Plain films of the bilateral hips showed no evidence of acute fracture.  CT abdomen/pelvis showed evidence of rectal fecal impaction, mild superior L1 and inferior T12 vertebral compression fractures, with indeterminate chronicity, although these appear new relative to CT scan from 2017.  Of note, ED physician assistant performed manual disimpaction following these results.  CT of the lumbar spine showed mild T12 and L1 superior endplate compression fractures, of unclear chronicity, as further described above, and also showed lower lumbar disc degeneration with moderate spinal stenosis and moderate bilateral neural foraminal stenosis at L4/L5 and moderate to severe neuroforaminal stenosis at L5/S1.  Per ED physician's physical exam, 5 out of 5 strength in the bilateral lower extremities was noted.   While in the ED, the following were administered: Dilaudid 0.5 mg IV x1 as well as a 1 L IV normal saline bolus.  Subsequently, the patient was admitted for overnight observation in order to optimize pharmacologic pain control in the setting of acute on chronic low back pain.   Review of Systems: As per HPI otherwise 10 point review of systems negative.   Past Medical History:  Diagnosis Date   Alzheimer disease ([place])    Blurry vision, right eye    Cataract extraction status of right eye    Cataract, right eye    Coronary artery disease    Enlarged prostate    Glaucoma    Heart block    s/p PPM INSERT   Hyperlipidemia    Hypertension    Stroke [facility])     Past Surgical History:  Procedure Laterality Date   INSERT / REPLACE / REMOVE PACEMAKER  04/13/04   INSERT PPM MEDTRONIC/Model Number:  [id number]     PPM Serial Number:  [id number]   PERMANENT PACEMAKER GENERATOR CHANGE N/A 01/07/2013   Procedure: PERMANENT PACEMAKER GENERATOR CHANGE;  Surgeon: Evans Lance, MD;  Location: [facility] CATH LAB;  Service:  Cardiovascular;  Laterality: N/A;    Social History:  reports that he has been smoking cigarettes. He has a 80.00 pack-year smoking history. He has never used smokeless tobacco. He reports that he does not drink alcohol or use drugs.   Allergies  Allergen Reactions   Xanax Xr [Alprazolam Er] Other (See Comments)    "he was out for 4 days."    History reviewed. No pertinent family history.   Prior to Admission medications   Medication Sig Start Date End Date Taking? Authorizing Provider  atorvastatin (LIPITOR) 20 MG tablet Take 20 mg by mouth daily.   Yes [provider]  donepezil (ARICEPT) 10 MG tablet Take 10 mg by mouth daily.   Yes [provider]  dutasteride (AVODART) 0.5 MG capsule Take 0.5 mg by mouth daily.   Yes [provider]  levocetirizine (XYZAL) 5 MG tablet Take 5 mg by mouth daily. 03/30/19  Yes [provider]  morphine (MS CONTIN) 15 MG 12 hr tablet Take 15 mg by mouth every 12 (twelve) hours.   Yes [provider]  morphine (MS CONTIN) 30 MG 12 hr tablet Take 30 mg by mouth 2 (two) times daily. 04/06/19  Yes [provider]  Oxycodone HCl  10 MG TABS Take 10 mg by mouth every 6 (six) hours.  03/30/19  Yes [provider]  Ascorbic Acid (VITAMIN C) 1000 MG tablet Take 1,000 mg by mouth daily.    [provider]  Cholecalciferol (VITAMIN D-3) 5000 UNITS TABS Take 5,000 Units by mouth daily.    [provider]  cyclobenzaprine (FLEXERIL) 5 MG tablet Take 5 mg by mouth every 8 (eight) hours as needed for muscle spasms.  04/14/19   [provider]  diclofenac sodium (VOLTAREN) 1 % GEL Apply 2 g topically 4 (four) times daily as needed (pain). 04/13/19   Long, Wonda Olds, MD  furosemide (LASIX) 40 MG tablet Take 40 mg by mouth daily. 11/10/18   [provider]  lidocaine (XYLOCAINE) 5 % ointment Apply 1 application topically 3 (three) times daily as needed. 04/13/19   Long, Wonda Olds, MD  Multiple Vitamin (MULTIVITAMIN WITH MINERALS) TABS Take 1 tablet by mouth daily.    [provider]  pantoprazole (PROTONIX) 40 MG tablet Take 1 tablet (40 mg total) by mouth 2 (two) times daily. Patient taking differently: Take 40 mg by mouth daily.  11/27/15   Isaac Bliss, Rayford Halsted, MD  valsartan-hydrochlorothiazide (DIOVAN-HCT) 160-12.5 MG per tablet Take 1 tablet by mouth daily.    [provider]     Objective    Physical Exam: Vitals:   04/28/19 1530 04/28/19 1600 04/28/19 1630 04/28/19 1700  BP: (!) 120/56 (!) 128/56 (!) 131/58 (!) 135/56  Pulse: 63 60 64 75  Resp: 16 17 17 16   Temp:      TempSrc:      SpO2: 94% 92% 91% 100%  Weight:      Height:        General: appears to be stated age; alert Skin: warm, dry. Head:  AT/[place] Eyes:  PEARL b/l, EOMI Mouth:  Oral mucosa membranes appear dry, normal dentition Neck: supple; trachea midline Heart:  RRR; did not appreciate any M/R/G Lungs: CTAB, did not appreciate any wheezes, rales, or rhonchi Abdomen: + BS; soft, ND Extremities: no peripheral edema, no muscle wasting Neuro: Sensation intact in bilateral lower extremities; unable to evaluate strength of bilateral lower extremities due to limitations posed by suboptimal pain control, although patient noted to be spontaneously moving bilateral lower extremities.   Labs on Admission: I have personally reviewed following labs and imaging studies  CBC: Recent Labs  Lab 04/28/19 1257  WBC 14.2*  NEUTROABS 12.3*  HGB 11.4*  HCT 36.7*  MCV 90.0  PLT 0000000   Basic Metabolic Panel: Recent Labs  Lab 04/28/19 1257  NA 140  K 4.9  CL 99  CO2 29  GLUCOSE 159*  BUN 40*  CREATININE 1.30*  CALCIUM 9.0   GFR: Estimated Creatinine Clearance: 28.4 mL/min (A) (by C-G formula based on SCr of 1.3 mg/dL (H)). Liver Function Tests: No results for input(s): AST, ALT, ALKPHOS, BILITOT, PROT, ALBUMIN in the last 168 hours. No results for input(s):  LIPASE, AMYLASE in the last 168 hours. No results for input(s): AMMONIA in the last 168 hours. Coagulation Profile: No results for input(s): INR, PROTIME in the last 168 hours. Cardiac Enzymes: No results for input(s): CKTOTAL, CKMB, CKMBINDEX, TROPONINI in the last 168 hours. BNP (last 3 results) No results for input(s): PROBNP in the last 8760 hours. HbA1C: No results for input(s): HGBA1C in the last 72 hours. CBG: No results for input(s): GLUCAP in the last 168 hours. Lipid Profile: No results for input(s):  CHOL, HDL, LDLCALC, TRIG, CHOLHDL, LDLDIRECT in the last 72 hours. Thyroid Function Tests: No results for input(s): TSH, T4TOTAL, FREET4, T3FREE, THYROIDAB in the last 72 hours. Anemia Panel: No results for input(s): VITAMINB12, FOLATE, FERRITIN, TIBC, IRON, RETICCTPCT in the last 72 hours. Urine analysis:    Component Value Date/Time   COLORURINE YELLOW 04/28/2019 1322   APPEARANCEUR HAZY (A) 04/28/2019 1322   LABSPEC 1.017 04/28/2019 1322   PHURINE 5.0 04/28/2019 1322   GLUCOSEU NEGATIVE 04/28/2019 1322   HGBUR NEGATIVE 04/28/2019 1322   BILIRUBINUR NEGATIVE 04/28/2019 1322   KETONESUR NEGATIVE 04/28/2019 1322   PROTEINUR 30 (A) 04/28/2019 1322   NITRITE NEGATIVE 04/28/2019 1322   LEUKOCYTESUR NEGATIVE 04/28/2019 1322    Radiological Exams on Admission: Dg Chest 1 View  Result Date: 04/28/2019 CLINICAL DATA:  Multiple falls over the past 3 days, initial encounter EXAM: CHEST  1 VIEW COMPARISON:  12/31/2018 FINDINGS: Cardiac shadow is mildly prominent but stable. Pacing device is again seen. Aortic calcifications are again noted. Patient is rotated to the left accentuating the mediastinal markings. The lungs are hyperinflated without focal infiltrate. Persistent soft tissue density to the right of the trachea is seen. This is related to prominent vascularity and stable from the prior study. No bony abnormality is seen. IMPRESSION: No acute abnormality noted.  Electronically Signed   By: Inez Catalina M.D.   On: 04/28/2019 14:24   Dg Lumbar Spine Complete  Result Date: 04/28/2019 CLINICAL DATA:  85 year old male with fall and back pain. EXAM: LUMBAR SPINE - COMPLETE 4+ VIEW COMPARISON:  Lumbar spine radiograph dated 12/31/2018. FINDINGS: There is no acute fracture or subluxation of the lumbar spine. There is straightening of normal lumbar lordosis which may be positional or due to muscle spasm or secondary to degenerative changes. There is osteopenia with mild old-appearing compression fracture of the superior endplate of L1 as well as chronic mild compression fracture of T12. There is grade 1 L4-L5 retrolisthesis. Disc desiccation and vacuum phenomena at L5-S1. Multilevel degenerative changes with osteophyte. Atherosclerotic calcification of the aorta. Several radiopaque foci in the region of the flanks bilaterally. There is moderate colonic stool burden with apparent colonic diverticula. IMPRESSION: 1. No acute fracture or subluxation of the lumbar spine. 2. Osteopenia with multilevel degenerative changes and mild old compression fractures of T12 and L1. 3. Grade 1 L4-L5 retrolisthesis. Electronically Signed   By: Anner Crete M.D.   On: 04/28/2019 14:22   Dg Hips Bilat W Or Wo Pelvis 3-4 Views  Result Date: 04/28/2019 CLINICAL DATA:  Recent falls over the past 3 days with hip pain, initial encounter EXAM: DG HIP (WITH OR WITHOUT PELVIS) 4V BILAT COMPARISON:  None. FINDINGS: Pelvic ring is intact. Degenerative changes of the hip joints are noted bilaterally. No acute fracture or dislocation is noted. No soft tissue abnormality is noted. Degenerative changes in the lumbar spine are noted. IMPRESSION: Degenerative changes without acute abnormality. Electronically Signed   By: Inez Catalina M.D.   On: 04/28/2019 14:23     Assessment/Plan   Bruce French is a 85 y.o. male with medical history significant for chronic low back pain, Alzheimer's dementia,  high-grade heart block status post pacemaker placement in 2005, who is admitted to [facility] for overnight observation on 04/28/2019 with acute on chronic low back pain after presenting from home to [facility] emergency department complaining of low back pain.    Principal Problem:   Low back pain Active Problems:   Dementia ([place])  Hyperlipidemia   Chronic constipation   Leukocytosis   Prerenal azotemia  #) Acute on chronic low back pain: In the setting of patient's daughters report that the patient has chronic low back pain in the setting of known multiple low back pathologies for which she has been told that he is not a surgical candidate, the patient presents this evening with worsening of his chronic low back pain over the last week, with overnight admission for pharmacologic optimization of pain control given his reported nonsurgical status.  While the patient's daughter does not recall a specific terminology associated with the patient's multiple chronic low back pathologies it appears based upon this evening's imaging that the patient has a history of chronic T12/L1 vertebral compression fractures as well as varying degrees of lumbar spinal stenosis as well as neural foraminal stenosis.  This evening's presentation does not appear to be associated with any overt red flag symptoms suggestive of emergent spinal cord compression, including no evidence of saddle anesthesia, worsening of chronic urinary retention, or fecal incontinence.  Will attempt to perform additional chart review to gain further insight into the degree of chronicity of the aforementioned lumbar pathologies.  Per daughter, suboptimal pain control as an outpatient over the course of the last week with scheduled MS Contin as well as scheduled short acting oxycodone.  Interestingly, it appears that the patient recently discontinued his Voltaren gel as well as lidocaine patch, which may have been providing more analgesic  benefits than anticipated by patient/daughter.  Overnight, will order as needed IV Dilaudid, as that appears to have improved the patient's discomfort in the emergency department, with anticipation of further discussions with the patient/his daughter in the morning regarding potential resumption of aforementioned topical agents.  Of note, unable to pursue MRI imaging of the lumbar spine in the context of patient's pacemaker.  Plan: As needed IV Dilaudid.  Consideration for resumption of Voltaren gel as well as lidocaine patch in order to improve pain control, and diminish extent of opioid dependence for adequate pain control.  Consider formal palliative care consult.  Will attempt additional chart review to gain further insight into the chronicity of patient's vertebral compression fractures as well as his lumbar spinal stenosis and neuroforaminal stenosis, as further described above.     #) Leukocytosis: Presenting CBC notable for white blood cell count of 14,000.  No additional clinical indicators of underlying infectious process, and I believe that this slight elevation in presenting white blood cell count is most likely inflammatory in nature in the setting of recent ground-level mechanical falls due to worsening pain control relating to low back discomfort as well as a hemoconcentrating effect of clinical evidence of mild dehydration.  Of note, urinalysis shows no evidence of underlying urinary tract infection.  Additionally, the patient is status post 1 L of IV normal saline bolus in the ED.  Plan: Monitor for results of COVID-19 nasopharyngeal swab performed in the ED this evening.  Repeat CBC with differential in the morning.  Will monitor for interval development of fever.     #) Chronic constipation: In the setting of chronic opioid use as well as diminished ambulatory activity in the setting of acute on chronic low back pain, with CP evidence of rectal fecal impaction followed by manual  disimpaction in the ED, as further described above.  Patient would likely benefit from initiation of an oral bowel regimen.  Plan: Start scheduled Colace twice daily as well as as needed MiraLAX, although up titration of this  OBR may be necessary.     #) Hyperlipidemia: On atorvastatin as an outpatient.  Plan: Continue home statin.     #) Chronic tobacco abuse: Patient is a current smoker, having smoked 1 pack/day over the last 8 years.  He appears resistant to recommendations to decrease his smoking habits.   Plan: Counseled the patient on the importance of complete smoking discontinuation as needed nicotine patch has been ordered for use during this hospitalization.     #) Benign prostatic hyperplasia: On dutasteride as an outpatient.  Plan: Continue home dutasteride, and monitor strict I's and O's.  Repeat BMP in the morning.    DVT prophylaxis: scd's (in setting of frequent falls).  Code Status: DNR/DNI (per patient's daughter, who is his POA).  Family Communication: Case discussed with the patient's daughter (POA), with whom the patient lives.  Disposition Plan:  Per Rounding Team Consults called: (none)  Admission status: Observation; MedSurg   PLEASE NOTE THAT DRAGON DICTATION SOFTWARE WAS USED IN THE CONSTRUCTION OF THIS NOTE.   [facility] Pager [phone] From 3PM- 11PM.   Otherwise, please contact night-coverage  [email or web address] Password [facility]  04/28/2019, 6:09 PM

## 2019-04-28 NOTE — ED Triage Notes (Signed)
Patient with multiple falls over the past 3 days. Began having fecal incontinence yesterday. Family reports pain is uncontrolled by his 45 mg morphine.

## 2019-04-28 NOTE — ED Notes (Signed)
Patient was placed on 2 liters of O2 for sats in the 70's. Rebounded to 90 quickly.

## 2019-04-28 NOTE — ED Notes (Signed)
Patient transported to CT 

## 2019-04-29 DIAGNOSIS — K5909 Other constipation: Secondary | ICD-10-CM | POA: Diagnosis present

## 2019-04-29 DIAGNOSIS — Z515 Encounter for palliative care: Secondary | ICD-10-CM

## 2019-04-29 DIAGNOSIS — Z7189 Other specified counseling: Secondary | ICD-10-CM

## 2019-04-29 DIAGNOSIS — D72829 Elevated white blood cell count, unspecified: Secondary | ICD-10-CM | POA: Diagnosis present

## 2019-04-29 DIAGNOSIS — R7989 Other specified abnormal findings of blood chemistry: Secondary | ICD-10-CM | POA: Diagnosis present

## 2019-04-29 LAB — BASIC METABOLIC PANEL
Anion gap: 11 (ref 5–15)
BUN: 33 mg/dL — ABNORMAL HIGH (ref 8–23)
CO2: 24 mmol/L (ref 22–32)
Calcium: 8.5 mg/dL — ABNORMAL LOW (ref 8.9–10.3)
Chloride: 106 mmol/L (ref 98–111)
Creatinine, Ser: 1.1 mg/dL (ref 0.61–1.24)
GFR calc Af Amer: >60 mL/min (ref >60)
GFR calc non Af Amer: 58 mL/min — ABNORMAL LOW (ref >60)
Glucose, Bld: 82 mg/dL (ref 70–99)
Potassium: 4 mmol/L (ref 3.5–5.1)
Sodium: 141 mmol/L (ref 135–145)

## 2019-04-29 LAB — CBC WITH DIFFERENTIAL/PLATELET
Abs Immature Granulocytes: 0.05 10*3/uL (ref 0.00–0.07)
Basophils Absolute: 0 10*3/uL (ref 0.0–0.1)
Basophils Relative: 0 %
Eosinophils Absolute: 0.1 10*3/uL (ref 0.0–0.5)
Eosinophils Relative: 1 %
HCT: 34.9 % — ABNORMAL LOW (ref 39.0–52.0)
Hemoglobin: 10.6 g/dL — ABNORMAL LOW (ref 13.0–17.0)
Immature Granulocytes: 1 %
Lymphocytes Relative: 16 %
Lymphs Abs: 1.6 10*3/uL (ref 0.7–4.0)
MCH: 27.6 pg (ref 26.0–34.0)
MCHC: 30.4 g/dL (ref 30.0–36.0)
MCV: 90.9 fL (ref 80.0–100.0)
Monocytes Absolute: 0.7 10*3/uL (ref 0.1–1.0)
Monocytes Relative: 7 %
Neutro Abs: 7.7 10*3/uL (ref 1.7–7.7)
Neutrophils Relative %: 75 %
Platelets: 289 10*3/uL (ref 150–400)
RBC: 3.84 MIL/uL — ABNORMAL LOW (ref 4.22–5.81)
RDW: 14.5 % (ref 11.5–15.5)
WBC: 10.1 10*3/uL (ref 4.0–10.5)
nRBC: 0 % (ref 0.0–0.2)

## 2019-04-29 LAB — MAGNESIUM: Magnesium: 2.1 mg/dL (ref 1.7–2.4)

## 2019-04-29 LAB — SARS CORONAVIRUS 2 (TAT 6-24 HRS): SARS Coronavirus 2: NEGATIVE

## 2019-04-29 MED ORDER — SENNA 8.6 MG PO TABS: 1.0000 | ORAL_TABLET | Freq: Every day | ORAL | Status: DC

## 2019-04-29 MED ORDER — MORPHINE SULFATE ER 30 MG PO TBCR
45.0000 mg | EXTENDED_RELEASE_TABLET | Freq: Two times a day (BID) | ORAL | Status: DC
  Administered 2019-04-29 – 2019-04-30 (×2): 45 mg via ORAL
  Filled 2019-04-29 (×2): qty 1

## 2019-04-29 MED ORDER — SORBITOL 70 % SOLN
960.0000 mL | TOPICAL_OIL | Freq: Once | ORAL | Status: DC
  Filled 2019-04-29: qty 473

## 2019-04-29 MED ORDER — HYDROMORPHONE HCL 1 MG/ML IJ SOLN
0.5000 mg | INTRAMUSCULAR | Status: DC | PRN
  Filled 2019-04-29: qty 0.5

## 2019-04-29 MED ORDER — MORPHINE SULFATE (CONCENTRATE) 10 MG/0.5ML PO SOLN
10.0000 mg | ORAL | Status: DC | PRN
  Administered 2019-04-29: 10 mg via SUBLINGUAL
  Filled 2019-04-29: qty 0.5

## 2019-04-29 MED ORDER — ORAL CARE MOUTH RINSE
15.0000 mL | Freq: Two times a day (BID) | OROMUCOSAL | Status: DC
  Administered 2019-04-29 (×3): 15 mL via OROMUCOSAL

## 2019-04-29 MED ORDER — METHOCARBAMOL 500 MG PO TABS
500.0000 mg | ORAL_TABLET | Freq: Three times a day (TID) | ORAL | Status: DC | PRN
  Administered 2019-04-30: 12:00:00 500 mg via ORAL
  Filled 2019-04-29: qty 1

## 2019-04-29 NOTE — Consult Note (Signed)
Consultation Note Date: 04/29/2019   Patient Name: Bruce French  DOB: 1926/02/25  MRN: [id number]  Age / Sex: 85 y.o., male  PCP: Curlene Labrum, MD Referring Physician: Rodena Goldmann, DO  Reason for Consultation: Establishing goals of care and Pain control  HPI/Patient Profile: 85 y.o. male  with past medical history of Alzheimer's dementia, pacemaker placement, HTN, HLD, stroke, glaucoma, enlarged prostate, falls admitted on 04/28/2019 with acute on chronic low back pain r/t L1, T12 compression fracture with spinal stenosis L4-S1.   Clinical Assessment and Goals of Care: I met today with Bruce French daughter, Bruce French. Bruce French is very tearful. I spoke with her as her father slept. He appears to be resting comfortably. Bruce French tells me that she is sad everything has happened so quickly even though she says that she knows this has been coming. She shares with me some complicating family dynamics and is stressed by her siblings. Bruce French has cared for her father for 14 years. She tells me that there are many children but she is the only one involved with her father's care (except for a brother that calls him 3 x a week but lives out of town). I reassured Bruce French that she can rest easy and have peace that she has taken good care of her father. Bruce French has support from her daughters, fiance, and 1 brother.   We also discussed goals of where to go from here. Bruce French tells me that he would not desire any surgical intervention including kyphoplasty. She knows that he does not want to go anywhere but home. She took care of her mother with hospice and is familiar. She agrees this would be helpful to care for her father and manage his pain. We discussed worsening pain at home and utilizing morphine liquid prn instead of OxyIR to better manage pain. Discussed that hospice will help titrate and make recommendations for pain.    All questions/concerns addressed. Emotional support provided.   Primary Decision Maker HCPOA daughter Bruce French    SUMMARY OF RECOMMENDATIONS   - Home with hospice  Code Status/Advance Care Planning:  DNR   Symptom Management:   Pain: MS Contin 45 mg every 12 hours (consider transition to fentanyl patch if ineffective recommendation 25 mcg/hr). Roxanol 10 mg SL every hour prn. Dilaudid 0.5 mg IV every 2 hours prn. May also consider liquid dilaudid prn. May eventually require infusion for comfort. Hospice to titrate as indicated.   Bowel Regimen: SMOG enema today. Senokot 1-2 tablets daily.   Palliative Prophylaxis:   Aspiration, Bowel Regimen, Delirium Protocol, Frequent Pain Assessment, Oral Care and Turn Reposition  Additional Recommendations (Limitations, Scope, Preferences):  Full Comfort Care  Psycho-social/Spiritual:   Desire for further Chaplaincy support:yes  Additional Recommendations: Caregiving  Support/Resources, Education on Hospice and Grief/Bereavement Support  Prognosis:   < 6 months  Discharge Planning: Home with Hospice      Primary Diagnoses: Present on Admission: . Low back pain . Hyperlipidemia . Dementia ([place]) . Chronic constipation . Leukocytosis . Prerenal azotemia  I have reviewed the medical record, interviewed the patient and family, and examined the patient. The following aspects are pertinent.  Past Medical History:  Diagnosis Date  . Alzheimer disease ([place])   . Blurry vision, right eye   . Cataract extraction status of right eye   . Cataract, right eye   . Coronary artery disease   . Enlarged prostate   . Glaucoma   . Heart block    s/p PPM INSERT  . Hyperlipidemia   . Hypertension   . Stroke [facility])    Social History   Socioeconomic History  . Marital status: Widowed    Spouse name: Not on file  . Number of children: Not on file  . Years of education: Not on file  . Highest education level: Not on file   Occupational History  . Not on file  Social Needs  . Financial resource strain: Not on file  . Food insecurity    Worry: Not on file    Inability: Not on file  . Transportation needs    Medical: Not on file    Non-medical: Not on file  Tobacco Use  . Smoking status: Current Every Day Smoker    Packs/day: 1.00    Years: 80.00    Pack years: 80.00    Types: Cigarettes  . Smokeless tobacco: Never Used  Substance and Sexual Activity  . Alcohol use: No  . Drug use: No  . Sexual activity: Not on file  Lifestyle  . Physical activity    Days per week: Not on file    Minutes per session: Not on file  . Stress: Not on file  Relationships  . Social Herbalist on phone: Not on file    Gets together: Not on file    Attends religious service: Not on file    Active member of club or organization: Not on file    Attends meetings of clubs or organizations: Not on file    Relationship status: Not on file  Other Topics Concern  . Not on file  Social History Narrative  . Not on file   History reviewed. No pertinent family history. Scheduled Meds: . atorvastatin  20 mg Oral Daily  . cholecalciferol  5,000 Units Oral Daily  . docusate sodium  100 mg Oral BID  . donepezil  10 mg Oral Daily  . dutasteride  0.5 mg Oral Daily  . loratadine  10 mg Oral Daily  . mouth rinse  15 mL Mouth Rinse BID  . multivitamin with minerals  1 tablet Oral Daily  . sodium chloride flush  3 mL Intravenous Q12H  . vitamin C  1,000 mg Oral Daily   Continuous Infusions: PRN Meds:.acetaminophen **OR** acetaminophen, HYDROmorphone (DILAUDID) injection, nicotine, polyethylene glycol Allergies  Allergen Reactions  . Xanax Xr [Alprazolam Er] Other (See Comments)    "he was out for 4 days."   Review of Systems  Unable to perform ROS: Dementia    Physical Exam Vitals signs and nursing note reviewed.  Constitutional:      General: He is sleeping.  Cardiovascular:     Rate and Rhythm: Normal  rate.  Pulmonary:     Effort: Pulmonary effort is normal. No tachypnea, accessory muscle usage or respiratory distress.  Abdominal:     General: There is distension.  Neurological:     Comments: Sleeping     Vital Signs: BP 138/60 (BP Location: Left Arm)   Pulse 69   Temp  98 F (36.7 C) (Oral)   Resp 14   Ht '5\' 9"'$  (1.753 m)   Wt 54.2 kg   SpO2 97%   BMI 17.65 kg/m  Pain Scale: 0-10   Pain Score: 0-No pain   SpO2: SpO2: 97 % O2 Device:SpO2: 97 % O2 Flow Rate: .O2 Flow Rate (L/min): 2 L/min  IO: Intake/output summary:   Intake/Output Summary (Last 24 hours) at 04/29/2019 1453 Last data filed at 04/29/2019 0600 Gross per 24 hour  Intake -  Output 200 ml  Net -200 ml    LBM: Last BM Date: 04/29/19 Baseline Weight: Weight: 54.2 kg Most recent weight: Weight: 54.2 kg     Palliative Assessment/Data:     Time In: 1415 Time Out: 1500 Time Total: 45 min Greater than 50%  of this time was spent counseling and coordinating care related to the above assessment and plan.  Signed by: Vinie Sill, NP Palliative Medicine Team Pager # [phone] (M-F 8a-5p) Team Phone # [phone] (Nights/Weekends)

## 2019-04-29 NOTE — TOC Initial Note (Signed)
Transition of Care [facility]) - Initial/Assessment Note    Patient Details  Name: MAIRON LICAUSI MRN: [id number] Date of Birth: [birth year]  Transition of Care [facility]) CM/SW Contact:    Allen Egerton, Chauncey Reading, RN Phone Number: 04/29/2019, 3:26 PM  Clinical Narrative:   Family electing to take patient home with hospice. Discussed with Sheila-dtr, she elects [facility]. Will need oxygen and bedside table delivered to home. Has hospital bed already.   Referral called to [place]. Loaded on hub.   Anticipate DC home tomorrow after DME arrives.             Expected Discharge Plan: Home w Hospice Care Barriers to Discharge: (hospice setup, equipment arrival)   Patient Goals and CMS Choice Patient states their goals for this hospitalization and ongoing recovery are:: family wishes for patient to return home with hospice CMS Medicare.gov Compare Post Acute Care list provided to:: Other (Comment Required)(dtr- sheila) Choice offered to / list presented to : Adult Children  Expected Discharge Plan and Services Expected Discharge Plan: [place]   Discharge Planning Services: CM Consult   Living arrangements for the past 2 months: Single Family Home                   DME Agency: Other - Comment         HH Agency: [facility]        Prior Living Arrangements/Services Living arrangements for the past 2 months: Single Family Home Lives with:: Adult Children          Need for Family Participation in Patient Care: Yes (Comment) Care giver support system in place?: Yes (comment)      Activities of Daily Living Home Assistive Devices/Equipment: Hospital bed, Cane (specify quad or straight), Walker (specify type) ADL Screening (condition at time of admission) Patient's cognitive ability adequate to safely complete daily activities?: No Is the patient deaf or have difficulty hearing?: No Does the patient have difficulty seeing, even when  wearing glasses/contacts?: No Does the patient have difficulty concentrating, remembering, or making decisions?: Yes Patient able to express need for assistance with ADLs?: Yes Does the patient have difficulty dressing or bathing?: Yes Independently performs ADLs?: Yes (appropriate for developmental age) Does the patient have difficulty walking or climbing stairs?: Yes Weakness of Legs: Both Weakness of Arms/Hands: Both       Admission diagnosis:  Low back pain [M54.5] Fall [W19.XXXA] Incomplete defecation [R15.0] Midline low back pain without sciatica, unspecified chronicity [M54.5] Patient Active Problem List   Diagnosis Date Noted  . Chronic constipation 04/29/2019  . Leukocytosis 04/29/2019  . Prerenal azotemia 04/29/2019  . Low back pain 04/28/2019  . Perforated duodenal ulcer ([place]) 11/22/2015  . Perforated ulcer of intestine ([place]) 11/22/2015  . Sepsis ([place]) 11/22/2015  . Emphysema lung ([place]) 11/22/2015  . Coronary artery disease 11/22/2015  . S/P placement of cardiac pacemaker 04/05/2013  . HTN (hypertension) 01/04/2013  . 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 COPD (chronic obstructive pulmonary disease) ([place]) 01/04/2013  . Dementia ([place]) 01/04/2013  . CKD (chronic kidney disease) 01/04/2013  . Diabetes type 2, controlled ([place]) 01/04/2013  . Hyperlipidemia 01/04/2013  . Depressive disorder 01/04/2013  . Emphysema 01/04/2013  . Cerebrovascular accident (stroke) ([place]) 01/04/2013   PCP:  Curlene Labrum, MD Pharmacy:   [facility] [address] [place] [address] Phone: [phone] Fax: [phone]     Social Determinants  of Health (SDOH) Interventions    Readmission Risk Interventions No flowsheet data found.  

## 2019-04-29 NOTE — Evaluation (Signed)
Physical Therapy Evaluation Patient Details Name: Bruce French MRN: [id number] DOB: Mar 04, 1926 Today's Date: 04/29/2019   History of Present Illness  Bruce French is a 85 y.o. male with medical history significant for chronic low back pain, Alzheimer's dementia, high-grade heart block status post pacemaker placement in 2005, who is admitted to [facility] for overnight observation on 04/28/2019 with acute on chronic low back pain after presenting from home to [facility] emergency department complaining of low back pain.    Clinical Impression  Patient limited for functional mobility as stated below secondary to BLE weakness, fatigue and poor standing balance. Patient able to ambulate into hallway with slow labored cadence, limited secondary to fatigue and c/o that legs are giving out.  Patient tolerated sitting up in chair after therapy - NT notified. Patient will benefit from continued physical therapy in hospital and recommended venue below to increase strength, balance, endurance for safe ADLs and gait.     Follow Up Recommendations SNF;Supervision for mobility/OOB;Supervision - Intermittent    Equipment Recommendations  None recommended by PT    Recommendations for Other Services       Precautions / Restrictions Precautions Precautions: Fall Restrictions Weight Bearing Restrictions: No      Mobility  Bed Mobility Overal bed mobility: Needs Assistance Bed Mobility: Supine to Sit     Supine to sit: Min assist     General bed mobility comments: slow labored movement  Transfers Overall transfer level: Needs assistance Equipment used: Rolling walker (2 wheeled) Transfers: Sit to/from [facility] Sit to Stand: Min assist Stand pivot transfers: Min assist       General transfer comment: increased time, labored movement  Ambulation/Gait Ambulation/Gait assistance: Min assist Gait Distance (Feet): 35 Feet Assistive device: Rolling walker  (2 wheeled) Gait Pattern/deviations: Decreased step length - right;Decreased step length - left;Decreased stride length Gait velocity: decreased   General Gait Details: slow labored cadence without loss of balance, limited secondary to c/o fatigue and stating legs are giving out  Stairs            Wheelchair Mobility    Modified Rankin (Stroke Patients Only)       Balance Overall balance assessment: Needs assistance Sitting-balance support: Feet supported;Bilateral upper extremity supported Sitting balance-Leahy Scale: Fair Sitting balance - Comments: seated at EOB   Standing balance support: During functional activity;Bilateral upper extremity supported Standing balance-Leahy Scale: Fair Standing balance comment: using RW                             Pertinent Vitals/Pain Pain Assessment: Faces Faces Pain Scale: Hurts even more Pain Location: buttocks Pain Descriptors / Indicators: Sore;Grimacing Pain Intervention(s): Limited activity within patient's tolerance;Monitored during session    Home Living Family/patient expects to be discharged to:: Private residence Living Arrangements: Children Available Help at Discharge: Family;Available PRN/intermittently Type of Home: House Home Access: Stairs to enter Entrance Stairs-Rails: None Entrance Stairs-Number of Steps: 2 Home Layout: Two level;Able to live on main level with bedroom/bathroom Home Equipment: Gilford Rile - 2 wheels;Cane - single point;Bedside commode;Shower seat;Wheelchair - manual;Hospital bed      Prior Function Level of Independence: Independent with assistive device(s)         Comments: household ambulator with RW     Hand Dominance        Extremity/Trunk Assessment   Upper Extremity Assessment Upper Extremity Assessment: Generalized weakness    Lower Extremity Assessment Lower Extremity  Assessment: Generalized weakness    Cervical / Trunk Assessment Cervical / Trunk  Assessment: Kyphotic  Communication   Communication: No difficulties  Cognition Arousal/Alertness: Awake/alert Behavior During Therapy: WFL for tasks assessed/performed Overall Cognitive Status: Within Functional Limits for tasks assessed                                        General Comments      Exercises     Assessment/Plan    PT Assessment Patient needs continued PT services  PT Problem List Decreased strength;Decreased activity tolerance;Decreased balance;Decreased mobility       PT Treatment Interventions Gait training;Stair training;Functional mobility training;Therapeutic activities;Therapeutic exercise;Patient/family education    PT Goals (Current goals can be found in the Care Plan section)  Acute Rehab PT Goals Patient Stated Goal: return home PT Goal Formulation: With patient/family Time For Goal Achievement: 05/13/19 Potential to Achieve Goals: Good    Frequency Min 3X/week   Barriers to discharge        Co-evaluation               AM-PAC PT "6 Clicks" Mobility  Outcome Measure Help needed turning from your back to your side while in a flat bed without using bedrails?: A Little Help needed moving from lying on your back to sitting on the side of a flat bed without using bedrails?: A Lot Help needed moving to and from a bed to a chair (including a wheelchair)?: A Little Help needed standing up from a chair using your arms (e.g., wheelchair or bedside chair)?: A Little Help needed to walk in hospital room?: A Lot Help needed climbing 3-5 steps with a railing? : A Lot 6 Click Score: 15    End of Session Equipment Utilized During Treatment: Gait belt Activity Tolerance: Patient tolerated treatment well;Patient limited by fatigue Patient left: in chair;with call bell/phone within reach Nurse Communication: Mobility status PT Visit Diagnosis: Unsteadiness on feet (R26.81);Other abnormalities of gait and mobility (R26.89);Muscle  weakness (generalized) (M62.81)    Time: [id number] PT Time Calculation (min) (ACUTE ONLY): 29 min   Charges:   PT Evaluation $PT Eval Moderate Complexity: 1 Mod PT Treatments $Therapeutic Activity: 23-37 mins        4:10 PM, 04/29/19 Lonell Grandchild, MPT Physical Therapist with [facility] [phone] office [phone] mobile phone

## 2019-04-29 NOTE — Plan of Care (Signed)
  Problem: Acute Rehab PT Goals(only PT should resolve) Goal: Pt Will Go Supine/Side To Sit Outcome: Progressing Flowsheets (Taken 04/29/2019 1611) Pt will go Supine/Side to Sit: with min guard assist Goal: Patient Will Transfer Sit To/From Stand Outcome: Progressing Flowsheets (Taken 04/29/2019 1611) Patient will transfer sit to/from stand: with min guard assist Goal: Pt Will Transfer Bed To Chair/Chair To Bed Outcome: Progressing Flowsheets (Taken 04/29/2019 1611) Pt will Transfer Bed to Chair/Chair to Bed: min guard assist Goal: Pt Will Ambulate Outcome: Progressing Flowsheets (Taken 04/29/2019 1611) Pt will Ambulate:  50 feet  with supervision  with min guard assist  with rolling walker   4:11 PM, 04/29/19 Lonell Grandchild, MPT Physical Therapist with [facility] [phone] office [phone] mobile phone

## 2019-04-29 NOTE — Progress Notes (Signed)
PROGRESS NOTE    Bruce French  [id number] DOB: Jan 12, 1926 DOA: 04/28/2019 PCP: Curlene Labrum, MD   Brief Narrative:  Per HPI: Bruce French is a 85 y.o. male with medical history significant for chronic low back pain, Alzheimer's dementia, high-grade heart block status post pacemaker placement in 2005, who is admitted to [facility] for overnight observation on 04/28/2019 with acute on chronic low back pain after presenting from home to [facility] emergency department complaining of low back pain.   In the setting of the patient's dementia, the following history is obtained via my discussions with the emergency department physician as well as via chart review.   Per the emergency department physician's discussions with the patient's daughter, with whom the patient lives, the daughter conveys that the patient has chronic low back pain in the setting of multiple low back pathologies, although she is unsure as to the specific nature of these chronic low back pathologies.  However, daughter conveys that the patient has been told that he is not a surgical candidate for these chronic low back pathologies, and consequently he has been pursuing the palliative care, as managed by his PCP.  However, over the last several days, patient's low back pain has intensified and was poorly controlled on outpatient regimen of MS Contin 30 mg p.o. twice daily as well as scheduled short acting oxycodone 10 mg p.o. every 6 hours.  The patient was also recently on multiple topical agents, including Voltaren gel as well as lidocaine patch, but he reportedly self discontinued these topical agents due to feeling that they were not beneficial in adequately controlling his discomfort.   The patient reportedly has not been complaining of any saddle anesthesia.  While he has a documented history of BPH, patient's daughter does not believe that the patient has been experiencing any recent worsening urinary  retention relative to baseline.  Additionally, the patient has a history of chronic constipation in the setting of his chronic opioid use, without any recent evidence of fecal incontinence.  In the setting of worsening low back discomfort over the last week, patient's daughter reports that the patient has experienced multiple ground-level mechanical falls over that time, which she relates to be on the basis of poor pain control as opposed to lower extremity weakness.  These falls were witnessed by the daughter, who reports that the patient did not hit his head as a component of any of these falls, nor was there any associated loss of consciousness.  Daughter reportedly reiterated that she is aware that the patient is not a surgical candidate for his low back pathology, but conveys that she is hoping for improved symptomatic control with optimized pain management.  She also confirms patient's code status to be DNR.  11/19: Patient appears to be comfortable at this point in time.  His imaging has demonstrated some acute compression fractures and daughter is unsure about any further procedures.  He also is noted to have fecal impaction and will try enema.  Palliative consultation currently pending.  Daughters appears to understand that he may be a need of home hospice services soon.  Assessment & Plan:   Principal Problem:   Low back pain Active Problems:   Dementia (HCC)   Hyperlipidemia   Chronic constipation   Leukocytosis   Prerenal azotemia   Acute on chronic low back pain with new compression fractures -Continue home medications -Continue IV Dilaudid -Palliative consultation pending with consideration for kyphoplasty as needed  Chronic  constipation with fecal impaction -We will try smog enema and may potentially need disimpaction  Alzheimer's dementia -Likely candidate for home hospice services soon  Dyslipidemia -Continue statin  Chronic tobacco abuse -Counseled on cessation  BPH  -On dutasteride  DVT prophylaxis: SCDs Code Status: DNR Family Communication: Communicated with daughter Disposition Plan: Anticipate discharge to home with hospice after stabilization.   Consultants:   Palliative care  Procedures:   None  Antimicrobials:   None   Subjective: Patient seen and evaluated today with some ongoing low back pain.  He is currently sleepy.  Objective: Vitals:   04/28/19 1930 04/28/19 2123 04/29/19 0547 04/29/19 0547  BP: 128/60 (!) 128/56  138/60  Pulse: 65 (!) 56  69  Resp: 16 16  14   Temp:  98.1 F (36.7 C)  98 F (36.7 C)  TempSrc:  Oral  Oral  SpO2: 94% 97%  97%  Weight:  56.6 kg 54.2 kg   Height:  5\' 9"  (1.753 m)      Intake/Output Summary (Last 24 hours) at 04/29/2019 1518 Last data filed at 04/29/2019 0600 Gross per 24 hour  Intake -  Output 200 ml  Net -200 ml   Filed Weights   04/28/19 1124 04/28/19 2123 04/29/19 0547  Weight: 54.2 kg 56.6 kg 54.2 kg    Examination:  General exam: Appears calm and comfortable  Respiratory system: Clear to auscultation. Respiratory effort normal. Cardiovascular system: S1 & S2 heard, RRR. No JVD, murmurs, rubs, gallops or clicks. No pedal edema. Gastrointestinal system: Abdomen is nondistended, soft and nontender. No organomegaly or masses felt. Normal bowel sounds heard. Central nervous system: Somnolent but arousable Extremities: Symmetric 5 x 5 power. Skin: No rashes, lesions or ulcers Psychiatry: Flat affect    Data Reviewed: I have personally reviewed following labs and imaging studies  CBC: Recent Labs  Lab 04/28/19 1257 04/29/19 0615  WBC 14.2* 10.1  NEUTROABS 12.3* 7.7  HGB 11.4* 10.6*  HCT 36.7* 34.9*  MCV 90.0 90.9  PLT 318 [id number]   Basic Metabolic Panel: Recent Labs  Lab 04/28/19 1257 04/29/19 0615  NA 140 141  K 4.9 4.0  CL 99 106  CO2 29 24  GLUCOSE 159* 82  BUN 40* 33*  CREATININE 1.30* 1.10  CALCIUM 9.0 8.5*  MG  --  2.1   GFR: Estimated  Creatinine Clearance: 33.5 mL/min (by C-G formula based on SCr of 1.1 mg/dL). Liver Function Tests: No results for input(s): AST, ALT, ALKPHOS, BILITOT, PROT, ALBUMIN in the last 168 hours. No results for input(s): LIPASE, AMYLASE in the last 168 hours. No results for input(s): AMMONIA in the last 168 hours. Coagulation Profile: No results for input(s): INR, PROTIME in the last 168 hours. Cardiac Enzymes: No results for input(s): CKTOTAL, CKMB, CKMBINDEX, TROPONINI in the last 168 hours. BNP (last 3 results) No results for input(s): PROBNP in the last 8760 hours. HbA1C: No results for input(s): HGBA1C in the last 72 hours. CBG: No results for input(s): GLUCAP in the last 168 hours. Lipid Profile: No results for input(s): CHOL, HDL, LDLCALC, TRIG, CHOLHDL, LDLDIRECT in the last 72 hours. Thyroid Function Tests: No results for input(s): TSH, T4TOTAL, FREET4, T3FREE, THYROIDAB in the last 72 hours. Anemia Panel: No results for input(s): VITAMINB12, FOLATE, FERRITIN, TIBC, IRON, RETICCTPCT in the last 72 hours. Sepsis Labs: No results for input(s): PROCALCITON, LATICACIDVEN in the last 168 hours.  No results found for this or any previous visit (from the past 240 hour(s)).  Radiology Studies: Ct Abdomen Pelvis Wo Contrast  Result Date: 04/28/2019 CLINICAL DATA:  Inpatient. Multiple falls over past 3 days. Fecal incontinence. Chronic mechanical back pain. EXAM: CT ABDOMEN AND PELVIS WITHOUT CONTRAST TECHNIQUE: Multidetector CT imaging of the abdomen and pelvis was performed following the standard protocol without IV contrast. COMPARISON:  11/22/2015 CT abdomen/pelvis. FINDINGS: Lower chest: Centrilobular emphysema and diffuse bronchial wall thickening at the lung bases. New thick bandlike consolidation with air bronchograms at the dependent right lung base. Parenchymal bands and ground-glass opacity at the dependent left lung base. Pacemaker leads terminate in the right atrium and  right ventricle. Coronary atherosclerosis. Hepatobiliary: Normal liver size. No liver masses. Granulomatous left liver calcifications are unchanged. Cholelithiasis. No gallbladder wall thickening or significant pericolonic fat stranding. No intrahepatic biliary ductal dilatation. Top normal caliber common bile duct (6 mm diameter). No radiopaque choledocholithiasis. Pancreas: No pancreatic mass. Mild dilatation (4 mm diameter) of the ventral pancreatic duct in the pancreatic head, similar. Spleen: Normal size. No mass. Adrenals/Urinary Tract: Stable 1.9 cm right adrenal adenoma with density -13 HU. Stable 1.7 cm left adrenal adenoma with density -6 HU. No renal stones. No hydronephrosis. Subjacent simple interpolar left renal cysts, largest 3.3 cm. No contour deforming right renal lesions. Mild bladder distention. Diffuse bladder wall thickening. Bilateral posterolateral bladder diverticulum measuring 5 cm on the left and 4 cm on the right with layering stones within the bladder diverticulum measuring 5 mm on the right and 4 mm on the left. Several layering bladder stones, largest 5 mm. Stomach/Bowel: Normal non-distended stomach. Normal caliber small bowel with no small bowel wall thickening. Normal appendix. Marked diffuse colonic diverticulosis. No colonic wall thickening or acute pericolonic fat stranding. Rectum distended by stool up to 8 cm diameter. Mild diffuse rectal wall thickening with haziness of the perirectal fat. No definite rectal pneumatosis. Vascular/Lymphatic: Atherosclerotic abdominal aorta with stable 2.7 cm ptotic infrarenal abdominal aorta. No pathologically enlarged lymph nodes in the abdomen or pelvis. Reproductive: Moderate prostatomegaly. Other: No pneumoperitoneum, ascites or focal fluid collection. Musculoskeletal: No aggressive appearing focal osseous lesions. Marked lumbar spondylosis. Mild superior L1 and inferior T12 vertebral compression fractures, new since 11/22/2015 CT.  IMPRESSION: 1. Rectal fecal impaction with CT findings suggestive of acute stercoral colitis. Fecal disimpaction suggested at this time. 2. Mild superior L1 and inferior T12 vertebral compression fractures of indeterminate chronicity, new since 2017 CT, cannot exclude acute fractures. 3. Evidence of chronic bladder outlet obstruction due to moderate prostatomegaly, with diffuse bladder wall thickening and moderate to large bilateral posterior bladder diverticula. Multiple small layering stones within the bladder and bilateral bladder diverticula. No hydronephrosis. Consider correlation with urinalysis to exclude acute cystitis. 4. Bandlike consolidation at the dependent right lung base, favor atelectasis, difficult to exclude a component of aspiration or pneumonia. 5. Cholelithiasis. 6. Stable bilateral adrenal adenomas. 7. Aortic Atherosclerosis (ICD10-I70.0) and Emphysema (ICD10-J43.9). Electronically Signed   By: Ilona Sorrel M.D.   On: 04/28/2019 20:24   Dg Chest 1 View  Result Date: 04/28/2019 CLINICAL DATA:  Multiple falls over the past 3 days, initial encounter EXAM: CHEST  1 VIEW COMPARISON:  12/31/2018 FINDINGS: Cardiac shadow is mildly prominent but stable. Pacing device is again seen. Aortic calcifications are again noted. Patient is rotated to the left accentuating the mediastinal markings. The lungs are hyperinflated without focal infiltrate. Persistent soft tissue density to the right of the trachea is seen. This is related to prominent vascularity and stable from the prior study. No bony abnormality is seen.  IMPRESSION: No acute abnormality noted. Electronically Signed   By: Inez Catalina M.D.   On: 04/28/2019 14:24   Dg Lumbar Spine Complete  Result Date: 04/28/2019 CLINICAL DATA:  85 year old male with fall and back pain. EXAM: LUMBAR SPINE - COMPLETE 4+ VIEW COMPARISON:  Lumbar spine radiograph dated 12/31/2018. FINDINGS: There is no acute fracture or subluxation of the lumbar spine. There  is straightening of normal lumbar lordosis which may be positional or due to muscle spasm or secondary to degenerative changes. There is osteopenia with mild old-appearing compression fracture of the superior endplate of L1 as well as chronic mild compression fracture of T12. There is grade 1 L4-L5 retrolisthesis. Disc desiccation and vacuum phenomena at L5-S1. Multilevel degenerative changes with osteophyte. Atherosclerotic calcification of the aorta. Several radiopaque foci in the region of the flanks bilaterally. There is moderate colonic stool burden with apparent colonic diverticula. IMPRESSION: 1. No acute fracture or subluxation of the lumbar spine. 2. Osteopenia with multilevel degenerative changes and mild old compression fractures of T12 and L1. 3. Grade 1 L4-L5 retrolisthesis. Electronically Signed   By: Anner Crete M.D.   On: 04/28/2019 14:22   Ct L-spine No Charge  Result Date: 04/28/2019 CLINICAL DATA:  Low back pain. Multiple recent falls. Fecal incontinence. EXAM: CT LUMBAR SPINE WITHOUT CONTRAST TECHNIQUE: Multidetector CT imaging of the lumbar spine was performed without intravenous contrast administration. Multiplanar CT image reconstructions were also generated. COMPARISON:  Lumbar spine radiographs 04/28/2019. CT abdomen and pelvis 11/22/2015. FINDINGS: Segmentation: 5 lumbar type vertebrae. Alignment: 5 mm retrolisthesis of L4 on L5. Trace retrolisthesis of L3 on L4. Trace anterolisthesis of L5 on S1. Vertebrae: Mild T12 inferior endplate and L1 superior endplate compression fractures which are new from 2017 and may be recent/unhealed. Nondisplaced fractures through the base of a large anterior vertebral osteophyte at T12-L1. 3 mm retropulsion of the posterosuperior L1 vertebral body. No posterior element fracture identified. No suspicious osseous lesion. Paraspinal and other soft tissues: Intra-abdominal and pelvic contents reported separately. Possible mild paravertebral soft tissue  edema at L1. Disc levels: Focally advanced disc degeneration at L5-S1 with moderate to severe disc space narrowing, vacuum disc, and endplate sclerosis. Moderate disc space narrowing at L2-3 and L4-5. Moderate spinal stenosis and moderate bilateral neural foraminal stenosis at L4-5 due to circumferential disc bulging and facet and ligamentum flavum hypertrophy. Moderate to severe bilateral neural foraminal stenosis at L5-S1 due to disc bulging, disc space height loss, and facet arthrosis. No spinal stenosis associated with mild L1 vertebral body retropulsion. IMPRESSION: 1. Mild T12 and L1 superior endplate compression fractures which may be recent/unhealed. Consider MRI for further evaluation. 2. Mild L1 vertebral body retropulsion without associated spinal stenosis. 3. Nondisplaced fractures through anterior vertebral osteophytes at T12-L1. 4. Predominantly lower lumbar disc and facet degeneration with moderate spinal stenosis and moderate bilateral neural foraminal stenosis at L4-5 and moderate to severe neural foraminal stenosis at L5-S1. Electronically Signed   By: Logan Bores M.D.   On: 04/28/2019 20:20   Dg Hips Bilat W Or Wo Pelvis 3-4 Views  Result Date: 04/28/2019 CLINICAL DATA:  Recent falls over the past 3 days with hip pain, initial encounter EXAM: DG HIP (WITH OR WITHOUT PELVIS) 4V BILAT COMPARISON:  None. FINDINGS: Pelvic ring is intact. Degenerative changes of the hip joints are noted bilaterally. No acute fracture or dislocation is noted. No soft tissue abnormality is noted. Degenerative changes in the lumbar spine are noted. IMPRESSION: Degenerative changes without acute abnormality. Electronically Signed  By: Inez Catalina M.D.   On: 04/28/2019 14:23        Scheduled Meds: . atorvastatin  20 mg Oral Daily  . cholecalciferol  5,000 Units Oral Daily  . docusate sodium  100 mg Oral BID  . donepezil  10 mg Oral Daily  . dutasteride  0.5 mg Oral Daily  . loratadine  10 mg Oral Daily   . mouth rinse  15 mL Mouth Rinse BID  . multivitamin with minerals  1 tablet Oral Daily  . sodium chloride flush  3 mL Intravenous Q12H  . sorbitol, milk of mag, mineral oil, glycerin (SMOG) enema  960 mL Rectal Once  . vitamin C  1,000 mg Oral Daily   Continuous Infusions:   LOS: 1 day    Time spent: 30 minutes    Muhammed Teutsch Darleen Crocker, DO [facility] Pager [phone]  If 7PM-7AM, please contact night-coverage [email or web address] Password [facility] 04/29/2019, 3:18 PM

## 2019-04-30 LAB — GLUCOSE, CAPILLARY
Glucose-Capillary: 103 mg/dL — ABNORMAL HIGH (ref 70–99)
Glucose-Capillary: 149 mg/dL — ABNORMAL HIGH (ref 70–99)
Glucose-Capillary: 221 mg/dL — ABNORMAL HIGH (ref 70–99)

## 2019-04-30 MED ORDER — METHOCARBAMOL 500 MG PO TABS: 500.0000 mg | ORAL_TABLET | Freq: Three times a day (TID) | ORAL | 0 refills | Status: AC | PRN

## 2019-04-30 MED ORDER — MORPHINE SULFATE (CONCENTRATE) 10 MG/0.5ML PO SOLN: 10.0000 mg | ORAL | 0 refills | Status: AC | PRN

## 2019-04-30 MED ORDER — MORPHINE SULFATE ER 15 MG PO TBCR: 45.0000 mg | EXTENDED_RELEASE_TABLET | Freq: Two times a day (BID) | ORAL | 0 refills | Status: AC

## 2019-04-30 MED ORDER — DOCUSATE SODIUM 100 MG PO CAPS: 100.0000 mg | ORAL_CAPSULE | Freq: Two times a day (BID) | ORAL | 0 refills | Status: AC

## 2019-04-30 MED ORDER — SENNA 8.6 MG PO TABS: 1.0000 | ORAL_TABLET | Freq: Every day | ORAL | 0 refills | Status: AC

## 2019-04-30 NOTE — Discharge Summary (Signed)
Physician Discharge Summary  Bruce French [id number] DOB: 03-02-1926 DOA: 04/28/2019  PCP: Curlene Labrum, MD  Admit date: 04/28/2019  Discharge date: 04/30/2019  Admitted From:Home  Disposition:  Home with hospice  Recommendations for Outpatient Follow-up:  1. Follow up with PCP in 1-2 weeks 2. Continue on pain medications as prescribed and follow-up with home hospice care  Home Health: Home with hospice  Equipment/Devices: Hospital bed and home oxygen  Discharge Condition: Stable  CODE STATUS: DNR  Diet recommendation: Heart Healthy  Brief/Interim Summary: Per HPI: Bruce Bruening Stultzis a 85 y.o.malewith medical history significant forchronic low back pain, Alzheimer's dementia,high-grade heart block status post pacemaker placement in 2005,who is admitted to [facility] for overnight observation on 04/28/2019 with acute on chronic low back pain after presenting from home to [facility] emergency department complaining of low back pain.  In the setting of the patient's dementia, the following history is obtained via my discussions with the emergency department physician as well as via chart review.  Per the emergency department physician'sdiscussions with the patient's daughter, with whom the patient lives, the daughter conveys that the patient has chronic low back pain in the setting of multiple low back pathologies,although she is unsure as to the specific nature of these chronic low back pathologies.However, daughter conveys that the patient has been told that he is not a surgical candidate for these chronic low back pathologies,and consequently he has been pursuing the palliative care, as managed by his PCP.However, over the last several days, patient's low back pain has intensified and was poorly controlled on outpatient regimen of MS Contin 30 mg p.o. twice daily as well as scheduled short acting oxycodone 10 mg p.o. every 6 hours.The patient was  also recently on multiple topical agents, including Voltaren gel as well as lidocaine patch,but he reportedly self discontinued these topical agents due to feeling that they were not beneficial in adequately controlling his discomfort.  The patient reportedly has not been complaining of any saddle anesthesia.While he has a documented history of BPH, patient's daughter does not believe that the patient has been experiencing any recent worseningurinary retention relative to baseline.Additionally, the patient has a history of chronic constipation in the setting of his chronic opioid use,without anyrecentevidence of fecal incontinence.In the setting of worsening low back discomfort over the last week, patient's daughter reports that the patient has experienced multiple ground-level mechanical falls over that time, which she relates to be on the basis of poor pain control as opposed to lower extremity weakness.These falls were witnessed by the daughter, who reports that the patient did not hit his head as a component of any of these falls, nor was there any associated loss of consciousness.Daughter reportedly reiterated that she is aware that the patient is not a surgical candidate for his low back pathology, but conveys that she is hoping for improved symptomatic control with optimized pain management.She also confirms patient's code statusto be DNR.  11/19: Patient appears to be comfortable at this point in time.  His imaging has demonstrated some acute compression fractures and daughter is unsure about any further procedures.  He also is noted to have fecal impaction and will try enema.  Palliative consultation currently pending.  Daughters appears to understand that he may be a need of home hospice services soon.  11/20: Patient is stable for discharge today and has had multiple bowel movements after administration of smog enema with noted fecal impaction on imaging.  He appears to be  feeling much more comfortable this morning on his new pain management regimen as recommended by palliative care on 11/19.  We will continue oral morphine solution as well as MS Contin and elevated dose as prescribed.  He will also need to be on bowel regimen with Senokot daily as well as Colace.  Discussion was had with daughter on 11/19 with both myself and palliative care regarding new compression fractures as well as fecal impaction.  Discussed the potential for kyphoplasty benefiting him, but she did not want to pursue this at this time and stated that her father would not want any further interventions.  She is agreeable to home hospice care and will have equipment set up with discharge to home hospice today.  No other acute events noted throughout the course of this admission.  Discharge Diagnoses:  Principal Problem:   Low back pain Active Problems:   Dementia (HCC)   Hyperlipidemia   Chronic constipation   Leukocytosis   Prerenal azotemia   Goals of care, counseling/discussion   Palliative care encounter  Principal discharge diagnosis: Acute on chronic low back pain in the setting of new compression fractures as well as fecal impaction.  Discharge Instructions  Discharge Instructions    Diet - low sodium heart healthy   Complete by: As directed    Increase activity slowly   Complete by: As directed      Allergies as of 04/30/2019      Reactions   Xanax Xr [alprazolam Er] Other (See Comments)   "he was out for 4 days."      Medication List    STOP taking these medications   atorvastatin 20 MG tablet Commonly known as: LIPITOR   morphine 30 MG 12 hr tablet Commonly known as: MS CONTIN Replaced by: morphine CONCENTRATE 10 MG/0.5ML Soln concentrated solution You also have another medication with the same name that you need to continue taking as instructed.   Oxycodone HCl 10 MG Tabs     TAKE these medications   cyclobenzaprine 5 MG tablet Commonly known as:  FLEXERIL Take 5 mg by mouth every 8 (eight) hours as needed for muscle spasms.   diclofenac sodium 1 % Gel Commonly known as: Voltaren Apply 2 g topically 4 (four) times daily as needed (pain).   docusate sodium 100 MG capsule Commonly known as: COLACE Take 1 capsule (100 mg total) by mouth 2 (two) times daily.   donepezil 10 MG tablet Commonly known as: ARICEPT Take 10 mg by mouth daily.   dutasteride 0.5 MG capsule Commonly known as: AVODART Take 0.5 mg by mouth daily.   levocetirizine 5 MG tablet Commonly known as: XYZAL Take 5 mg by mouth daily.   lidocaine 5 % ointment Commonly known as: XYLOCAINE Apply 1 application topically 3 (three) times daily as needed.   methocarbamol 500 MG tablet Commonly known as: ROBAXIN Take 1 tablet (500 mg total) by mouth every 8 (eight) hours as needed for muscle spasms.   morphine CONCENTRATE 10 MG/0.5ML Soln concentrated solution Place 0.5 mLs (10 mg total) under the tongue every hour as needed for severe pain or shortness of breath. What changed: You were already taking a medication with the same name, and this prescription was added. Make sure you understand how and when to take each. Replaces: morphine 30 MG 12 hr tablet   morphine 15 MG 12 hr tablet Commonly known as: MS CONTIN Take 3 tablets (45 mg total) by mouth every 12 (twelve) hours. What changed:  how much to take  Another medication with the same name was removed. Continue taking this medication, and follow the directions you see here.   multivitamin with minerals Tabs tablet Take 1 tablet by mouth daily.   senna 8.6 MG Tabs tablet Commonly known as: SENOKOT Take 1 tablet (8.6 mg total) by mouth at bedtime.   vitamin C 1000 MG tablet Take 1,000 mg by mouth daily.   Vitamin D-3 125 MCG (5000 UT) Tabs Take 5,000 Units by mouth daily.      Follow-up Information    Burdine, Virgina Evener, MD Follow up in 1 week(s).   Specialty: Family Medicine Contact  information: [address] [place] [address] [phone]          Allergies  Allergen Reactions  . Xanax Xr [Alprazolam Er] Other (See Comments)    "he was out for 4 days."    Consultations:  Palliative care   Procedures/Studies: Ct Abdomen Pelvis Wo Contrast  Result Date: 04/28/2019 CLINICAL DATA:  Inpatient. Multiple falls over past 3 days. Fecal incontinence. Chronic mechanical back pain. EXAM: CT ABDOMEN AND PELVIS WITHOUT CONTRAST TECHNIQUE: Multidetector CT imaging of the abdomen and pelvis was performed following the standard protocol without IV contrast. COMPARISON:  11/22/2015 CT abdomen/pelvis. FINDINGS: Lower chest: Centrilobular emphysema and diffuse bronchial wall thickening at the lung bases. New thick bandlike consolidation with air bronchograms at the dependent right lung base. Parenchymal bands and ground-glass opacity at the dependent left lung base. Pacemaker leads terminate in the right atrium and right ventricle. Coronary atherosclerosis. Hepatobiliary: Normal liver size. No liver masses. Granulomatous left liver calcifications are unchanged. Cholelithiasis. No gallbladder wall thickening or significant pericolonic fat stranding. No intrahepatic biliary ductal dilatation. Top normal caliber common bile duct (6 mm diameter). No radiopaque choledocholithiasis. Pancreas: No pancreatic mass. Mild dilatation (4 mm diameter) of the ventral pancreatic duct in the pancreatic head, similar. Spleen: Normal size. No mass. Adrenals/Urinary Tract: Stable 1.9 cm right adrenal adenoma with density -13 HU. Stable 1.7 cm left adrenal adenoma with density -6 HU. No renal stones. No hydronephrosis. Subjacent simple interpolar left renal cysts, largest 3.3 cm. No contour deforming right renal lesions. Mild bladder distention. Diffuse bladder wall thickening. Bilateral posterolateral bladder diverticulum measuring 5 cm on the left and 4 cm on the right with layering stones within the bladder  diverticulum measuring 5 mm on the right and 4 mm on the left. Several layering bladder stones, largest 5 mm. Stomach/Bowel: Normal non-distended stomach. Normal caliber small bowel with no small bowel wall thickening. Normal appendix. Marked diffuse colonic diverticulosis. No colonic wall thickening or acute pericolonic fat stranding. Rectum distended by stool up to 8 cm diameter. Mild diffuse rectal wall thickening with haziness of the perirectal fat. No definite rectal pneumatosis. Vascular/Lymphatic: Atherosclerotic abdominal aorta with stable 2.7 cm ptotic infrarenal abdominal aorta. No pathologically enlarged lymph nodes in the abdomen or pelvis. Reproductive: Moderate prostatomegaly. Other: No pneumoperitoneum, ascites or focal fluid collection. Musculoskeletal: No aggressive appearing focal osseous lesions. Marked lumbar spondylosis. Mild superior L1 and inferior T12 vertebral compression fractures, new since 11/22/2015 CT. IMPRESSION: 1. Rectal fecal impaction with CT findings suggestive of acute stercoral colitis. Fecal disimpaction suggested at this time. 2. Mild superior L1 and inferior T12 vertebral compression fractures of indeterminate chronicity, new since 2017 CT, cannot exclude acute fractures. 3. Evidence of chronic bladder outlet obstruction due to moderate prostatomegaly, with diffuse bladder wall thickening and moderate to large bilateral posterior bladder diverticula. Multiple small layering stones  within the bladder and bilateral bladder diverticula. No hydronephrosis. Consider correlation with urinalysis to exclude acute cystitis. 4. Bandlike consolidation at the dependent right lung base, favor atelectasis, difficult to exclude a component of aspiration or pneumonia. 5. Cholelithiasis. 6. Stable bilateral adrenal adenomas. 7. Aortic Atherosclerosis (ICD10-I70.0) and Emphysema (ICD10-J43.9). Electronically Signed   By: Ilona Sorrel M.D.   On: 04/28/2019 20:24   Dg Chest 1 View  Result  Date: 04/28/2019 CLINICAL DATA:  Multiple falls over the past 3 days, initial encounter EXAM: CHEST  1 VIEW COMPARISON:  12/31/2018 FINDINGS: Cardiac shadow is mildly prominent but stable. Pacing device is again seen. Aortic calcifications are again noted. Patient is rotated to the left accentuating the mediastinal markings. The lungs are hyperinflated without focal infiltrate. Persistent soft tissue density to the right of the trachea is seen. This is related to prominent vascularity and stable from the prior study. No bony abnormality is seen. IMPRESSION: No acute abnormality noted. Electronically Signed   By: Inez Catalina M.D.   On: 04/28/2019 14:24   Dg Lumbar Spine Complete  Result Date: 04/28/2019 CLINICAL DATA:  85 year old male with fall and back pain. EXAM: LUMBAR SPINE - COMPLETE 4+ VIEW COMPARISON:  Lumbar spine radiograph dated 12/31/2018. FINDINGS: There is no acute fracture or subluxation of the lumbar spine. There is straightening of normal lumbar lordosis which may be positional or due to muscle spasm or secondary to degenerative changes. There is osteopenia with mild old-appearing compression fracture of the superior endplate of L1 as well as chronic mild compression fracture of T12. There is grade 1 L4-L5 retrolisthesis. Disc desiccation and vacuum phenomena at L5-S1. Multilevel degenerative changes with osteophyte. Atherosclerotic calcification of the aorta. Several radiopaque foci in the region of the flanks bilaterally. There is moderate colonic stool burden with apparent colonic diverticula. IMPRESSION: 1. No acute fracture or subluxation of the lumbar spine. 2. Osteopenia with multilevel degenerative changes and mild old compression fractures of T12 and L1. 3. Grade 1 L4-L5 retrolisthesis. Electronically Signed   By: Anner Crete M.D.   On: 04/28/2019 14:22   Ct Head Wo Contrast  Result Date: 04/13/2019 CLINICAL DATA:  Headache radiating into the neck and bilateral shoulder since  last night. No reported injury. EXAM: CT HEAD WITHOUT CONTRAST CT CERVICAL SPINE WITHOUT CONTRAST TECHNIQUE: Multidetector CT imaging of the head and cervical spine was performed following the standard protocol without intravenous contrast. Multiplanar CT image reconstructions of the cervical spine were also generated. COMPARISON:  03/02/2018 head and cervical spine CT. FINDINGS: CT HEAD FINDINGS Brain: Chronic superior right frontoparietal encephalomalacia. No evidence of parenchymal hemorrhage or extra-axial fluid collection. No mass lesion, mass effect, or midline shift. No CT evidence of acute infarction. Generalized cerebral volume loss. Nonspecific mild subcortical and periventricular white matter hypodensity, most in keeping with chronic small vessel ischemic change. Cerebral ventricle sizes are stable and concordant with the degree of cerebral volume loss. Vascular: No acute abnormality. Skull: No evidence of calvarial fracture. Sinuses/Orbits: The visualized paranasal sinuses are essentially clear. Other: Chronic bilateral mastoid effusions, right greater than left. CT CERVICAL SPINE FINDINGS Alignment: Straightening of the cervical spine. No facet subluxation. Dens is well positioned between the lateral masses of C1. Skull base and vertebrae: No acute fracture. No primary bone lesion or focal pathologic process. Soft tissues and spinal canal: No prevertebral edema. No visible canal hematoma. Disc levels: Moderate-to-marked multilevel cervical degenerative disc disease, most prominent at C6-7. Advanced bilateral facet arthropathy. Mild degenerative foraminal stenosis bilaterally at  C3-4 and on the left at C4-5, on the right at C5-6 and bilaterally at C6-7 predominantly due to uncovertebral hypertrophy. Upper chest: Centrilobular and paraseptal emphysema both lung apices. Other: Chronic bilateral mastoid effusions. No discrete thyroid nodules. No pathologically enlarged cervical nodes. Chronic coarse  calcifications throughout the parotid glands bilaterally. IMPRESSION: 1.  No evidence of acute intracranial abnormality. 2. Chronic right superior frontal parietal encephalomalacia. 3. Generalized cerebral volume loss and chronic mild small vessel ischemic changes in the cerebral white matter. 4. Chronic bilateral mastoid effusions, right greater than left. 5. No acute osseous abnormality in the cervical spine. 6. Moderate multilevel degenerative changes in the cervical spine as detailed. 7.  Emphysema (ICD10-J43.9). Electronically Signed   By: Ilona Sorrel M.D.   On: 04/13/2019 12:23   Ct Cervical Spine Wo Contrast  Result Date: 04/13/2019 CLINICAL DATA:  Headache radiating into the neck and bilateral shoulder since last night. No reported injury. EXAM: CT HEAD WITHOUT CONTRAST CT CERVICAL SPINE WITHOUT CONTRAST TECHNIQUE: Multidetector CT imaging of the head and cervical spine was performed following the standard protocol without intravenous contrast. Multiplanar CT image reconstructions of the cervical spine were also generated. COMPARISON:  03/02/2018 head and cervical spine CT. FINDINGS: CT HEAD FINDINGS Brain: Chronic superior right frontoparietal encephalomalacia. No evidence of parenchymal hemorrhage or extra-axial fluid collection. No mass lesion, mass effect, or midline shift. No CT evidence of acute infarction. Generalized cerebral volume loss. Nonspecific mild subcortical and periventricular white matter hypodensity, most in keeping with chronic small vessel ischemic change. Cerebral ventricle sizes are stable and concordant with the degree of cerebral volume loss. Vascular: No acute abnormality. Skull: No evidence of calvarial fracture. Sinuses/Orbits: The visualized paranasal sinuses are essentially clear. Other: Chronic bilateral mastoid effusions, right greater than left. CT CERVICAL SPINE FINDINGS Alignment: Straightening of the cervical spine. No facet subluxation. Dens is well positioned  between the lateral masses of C1. Skull base and vertebrae: No acute fracture. No primary bone lesion or focal pathologic process. Soft tissues and spinal canal: No prevertebral edema. No visible canal hematoma. Disc levels: Moderate-to-marked multilevel cervical degenerative disc disease, most prominent at C6-7. Advanced bilateral facet arthropathy. Mild degenerative foraminal stenosis bilaterally at C3-4 and on the left at C4-5, on the right at C5-6 and bilaterally at C6-7 predominantly due to uncovertebral hypertrophy. Upper chest: Centrilobular and paraseptal emphysema both lung apices. Other: Chronic bilateral mastoid effusions. No discrete thyroid nodules. No pathologically enlarged cervical nodes. Chronic coarse calcifications throughout the parotid glands bilaterally. IMPRESSION: 1.  No evidence of acute intracranial abnormality. 2. Chronic right superior frontal parietal encephalomalacia. 3. Generalized cerebral volume loss and chronic mild small vessel ischemic changes in the cerebral white matter. 4. Chronic bilateral mastoid effusions, right greater than left. 5. No acute osseous abnormality in the cervical spine. 6. Moderate multilevel degenerative changes in the cervical spine as detailed. 7.  Emphysema (ICD10-J43.9). Electronically Signed   By: Ilona Sorrel M.D.   On: 04/13/2019 12:23   Ct L-spine No Charge  Result Date: 04/28/2019 CLINICAL DATA:  Low back pain. Multiple recent falls. Fecal incontinence. EXAM: CT LUMBAR SPINE WITHOUT CONTRAST TECHNIQUE: Multidetector CT imaging of the lumbar spine was performed without intravenous contrast administration. Multiplanar CT image reconstructions were also generated. COMPARISON:  Lumbar spine radiographs 04/28/2019. CT abdomen and pelvis 11/22/2015. FINDINGS: Segmentation: 5 lumbar type vertebrae. Alignment: 5 mm retrolisthesis of L4 on L5. Trace retrolisthesis of L3 on L4. Trace anterolisthesis of L5 on S1. Vertebrae: Mild T12 inferior endplate and  L1 superior endplate compression fractures which are new from 2017 and may be recent/unhealed. Nondisplaced fractures through the base of a large anterior vertebral osteophyte at T12-L1. 3 mm retropulsion of the posterosuperior L1 vertebral body. No posterior element fracture identified. No suspicious osseous lesion. Paraspinal and other soft tissues: Intra-abdominal and pelvic contents reported separately. Possible mild paravertebral soft tissue edema at L1. Disc levels: Focally advanced disc degeneration at L5-S1 with moderate to severe disc space narrowing, vacuum disc, and endplate sclerosis. Moderate disc space narrowing at L2-3 and L4-5. Moderate spinal stenosis and moderate bilateral neural foraminal stenosis at L4-5 due to circumferential disc bulging and facet and ligamentum flavum hypertrophy. Moderate to severe bilateral neural foraminal stenosis at L5-S1 due to disc bulging, disc space height loss, and facet arthrosis. No spinal stenosis associated with mild L1 vertebral body retropulsion. IMPRESSION: 1. Mild T12 and L1 superior endplate compression fractures which may be recent/unhealed. Consider MRI for further evaluation. 2. Mild L1 vertebral body retropulsion without associated spinal stenosis. 3. Nondisplaced fractures through anterior vertebral osteophytes at T12-L1. 4. Predominantly lower lumbar disc and facet degeneration with moderate spinal stenosis and moderate bilateral neural foraminal stenosis at L4-5 and moderate to severe neural foraminal stenosis at L5-S1. Electronically Signed   By: Logan Bores M.D.   On: 04/28/2019 20:20   Dg Hips Bilat W Or Wo Pelvis 3-4 Views  Result Date: 04/28/2019 CLINICAL DATA:  Recent falls over the past 3 days with hip pain, initial encounter EXAM: DG HIP (WITH OR WITHOUT PELVIS) 4V BILAT COMPARISON:  None. FINDINGS: Pelvic ring is intact. Degenerative changes of the hip joints are noted bilaterally. No acute fracture or dislocation is noted. No soft  tissue abnormality is noted. Degenerative changes in the lumbar spine are noted. IMPRESSION: Degenerative changes without acute abnormality. Electronically Signed   By: Inez Catalina M.D.   On: 04/28/2019 14:23     Discharge Exam: Vitals:   04/29/19 2221 04/30/19 0629  BP: 134/63 (!) 142/52  Pulse: 70 66  Resp:  14  Temp: 98.3 F (36.8 C) 98.1 F (36.7 C)  SpO2: 93% 97%   Vitals:   04/29/19 0547 04/29/19 0547 04/29/19 2221 04/30/19 0629  BP:  138/60 134/63 (!) 142/52  Pulse:  69 70 66  Resp:  14  14  Temp:  98 F (36.7 C) 98.3 F (36.8 C) 98.1 F (36.7 C)  TempSrc:  Oral Oral Oral  SpO2:  97% 93% 97%  Weight: 54.2 kg     Height:        General: Pt is alert, awake, not in acute distress Cardiovascular: RRR, S1/S2 +, no rubs, no gallops Respiratory: CTA bilaterally, no wheezing, no rhonchi Abdominal: Soft, NT, ND, bowel sounds + Extremities: no edema, no cyanosis    The results of significant diagnostics from this hospitalization (including imaging, microbiology, ancillary and laboratory) are listed below for reference.     Microbiology: Recent Results (from the past 240 hour(s))  SARS CORONAVIRUS 2 (TAT 6-24 HRS) Nasopharyngeal Nasopharyngeal Swab     Status: None   Collection Time: 04/28/19  5:54 PM   Specimen: Nasopharyngeal Swab  Result Value Ref Range Status   SARS Coronavirus 2 NEGATIVE NEGATIVE Final    Comment: (NOTE) SARS-CoV-2 target nucleic acids are NOT DETECTED. The SARS-CoV-2 RNA is generally detectable in upper and lower respiratory specimens during the acute phase of infection. Negative results do not preclude SARS-CoV-2 infection, do not rule out co-infections with other pathogens, and should not be  used as the sole basis for treatment or other patient management decisions. Negative results must be combined with clinical observations, patient history, and epidemiological information. The expected result is Negative. Fact Sheet for  Patients: SugarRoll.be Fact Sheet for Healthcare Providers: https://www.woods-mathews.com/ This test is not yet approved or cleared by the [place] FDA and  has been authorized for detection and/or diagnosis of SARS-CoV-2 by FDA under an Emergency Use Authorization (EUA). This EUA will remain  in effect (meaning this test can be used) for the duration of the COVID-19 declaration under Section 56 4(b)(1) of the Act, 21 U.S.C. section 360bbb-3(b)(1), unless the authorization is terminated or revoked sooner. Performed at [facility], [place] [address] [place], [place] [address]      Labs: BNP (last 3 results) No results for input(s): BNP in the last 8760 hours. Basic Metabolic Panel: Recent Labs  Lab 04/28/19 1257 04/29/19 0615  NA 140 141  K 4.9 4.0  CL 99 106  CO2 29 24  GLUCOSE 159* 82  BUN 40* 33*  CREATININE 1.30* 1.10  CALCIUM 9.0 8.5*  MG  --  2.1   Liver Function Tests: No results for input(s): AST, ALT, ALKPHOS, BILITOT, PROT, ALBUMIN in the last 168 hours. No results for input(s): LIPASE, AMYLASE in the last 168 hours. No results for input(s): AMMONIA in the last 168 hours. CBC: Recent Labs  Lab 04/28/19 1257 04/29/19 0615  WBC 14.2* 10.1  NEUTROABS 12.3* 7.7  HGB 11.4* 10.6*  HCT 36.7* 34.9*  MCV 90.0 90.9  PLT 318 289   Cardiac Enzymes: No results for input(s): CKTOTAL, CKMB, CKMBINDEX, TROPONINI in the last 168 hours. BNP: Invalid input(s): POCBNP CBG: Recent Labs  Lab 04/30/19 0753  GLUCAP 103*   D-Dimer No results for input(s): DDIMER in the last 72 hours. Hgb A1c No results for input(s): HGBA1C in the last 72 hours. Lipid Profile No results for input(s): CHOL, HDL, LDLCALC, TRIG, CHOLHDL, LDLDIRECT in the last 72 hours. Thyroid function studies No results for input(s): TSH, T4TOTAL, T3FREE, THYROIDAB in the last 72 hours.  Invalid input(s): FREET3 Anemia work up No results for  input(s): VITAMINB12, FOLATE, FERRITIN, TIBC, IRON, RETICCTPCT in the last 72 hours. Urinalysis    Component Value Date/Time   COLORURINE YELLOW 04/28/2019 1322   APPEARANCEUR HAZY (A) 04/28/2019 1322   LABSPEC 1.017 04/28/2019 1322   PHURINE 5.0 04/28/2019 1322   GLUCOSEU NEGATIVE 04/28/2019 1322   HGBUR NEGATIVE 04/28/2019 1322   BILIRUBINUR NEGATIVE 04/28/2019 1322   KETONESUR NEGATIVE 04/28/2019 1322   PROTEINUR 30 (A) 04/28/2019 1322   NITRITE NEGATIVE 04/28/2019 1322   LEUKOCYTESUR NEGATIVE 04/28/2019 1322   Sepsis Labs Invalid input(s): PROCALCITONIN,  WBC,  LACTICIDVEN Microbiology Recent Results (from the past 240 hour(s))  SARS CORONAVIRUS 2 (TAT 6-24 HRS) Nasopharyngeal Nasopharyngeal Swab     Status: None   Collection Time: 04/28/19  5:54 PM   Specimen: Nasopharyngeal Swab  Result Value Ref Range Status   SARS Coronavirus 2 NEGATIVE NEGATIVE Final    Comment: (NOTE) SARS-CoV-2 target nucleic acids are NOT DETECTED. The SARS-CoV-2 RNA is generally detectable in upper and lower respiratory specimens during the acute phase of infection. Negative results do not preclude SARS-CoV-2 infection, do not rule out co-infections with other pathogens, and should not be used as the sole basis for treatment or other patient management decisions. Negative results must be combined with clinical observations, patient history, and epidemiological information. The expected result is Negative. Fact Sheet for  Patients: SugarRoll.be Fact Sheet for Healthcare Providers: https://www.woods-mathews.com/ This test is not yet approved or cleared by the [place] FDA and  has been authorized for detection and/or diagnosis of SARS-CoV-2 by FDA under an Emergency Use Authorization (EUA). This EUA will remain  in effect (meaning this test can be used) for the duration of the COVID-19 declaration under Section 56 4(b)(1) of the Act, 21 U.S.C. section  360bbb-3(b)(1), unless the authorization is terminated or revoked sooner. Performed at [facility], [place] [address] [place], [place] [address]      Time coordinating discharge: 35 minutes  SIGNED:   Rodena Goldmann, DO [facility] 04/30/2019, 10:32 AM  If 7PM-7AM, please contact night-coverage [email or web address]

## 2019-04-30 NOTE — Progress Notes (Signed)
Nutrition Brief Note  Chart reviewed. RD drawn to patient due to malnutrition screen. Pt now transitioning to hospice services and plans to discharge home.  No further nutrition interventions warranted at this time.  Please re-consult as needed.    Colman Cater MS,RD,CSG,LDN Office: [phone] Pager: [phone]

## 2019-04-30 NOTE — Care Management (Signed)
Updated bedside RN, she will need to verify DME/o2 has been delivered to home and then arrange EMS. Anticipate DME delivery soon.

## 2019-04-30 NOTE — Plan of Care (Signed)
  Problem: Education: Goal: Knowledge of General Education information will improve Description Including pain rating scale, medication(s)/side effects and non-pharmacologic comfort measures Outcome: Progressing   

## 2019-04-30 NOTE — Care Management Important Message (Signed)
Important Message  Patient Details  Name: Bruce French MRN: [id number] Date of Birth: [birth year]   Medicare Important Message Given:  Yes     [name] 04/30/2019, 2:50 PM

## 2019-04-30 NOTE — TOC Progression Note (Signed)
Transition of Care [facility]) - Progression Note    Patient Details  Name: GRIFFYN REINBOLD MRN: [id number] Date of Birth: [birth year]  Transition of Care [facility]) CM/SW Contact  Maliik Karner, Chauncey Reading, RN Phone Number: 04/30/2019, 10:37 AM  Clinical Narrative:   Jeralene Huff out to daughter in regards to DC, she has not scheduled the delivery of DME yet. She is having to have her 11 parrots out of the home first. She says someone is coming to get the birds at 1, we plan for another call at 1:30.   Hospice is ready to receive patient in the home today.     Expected Discharge Plan: Home w Hospice Care Barriers to Discharge: (hospice setup, equipment arrival)  Expected Discharge Plan and Services Expected Discharge Plan: [place]   Discharge Planning Services: CM Consult   Living arrangements for the past 2 months: Single Family Home Expected Discharge Date: 04/30/19                 DME Agency: Other - Comment         HH Agency: [facility]         Social Determinants of Health (SDOH) Interventions    Readmission Risk Interventions No flowsheet data found.

## 2019-06-11 DEATH — deceased

## 2020-02-23 IMAGING — DX DG CHEST 1V
2 series · 2 of 2 positions shown · non-contrast
Comparison: 12/31/2018

CLINICAL DATA: Multiple falls over the past 3 days, initial
encounter

EXAM:
CHEST  1 VIEW

[chest pa]
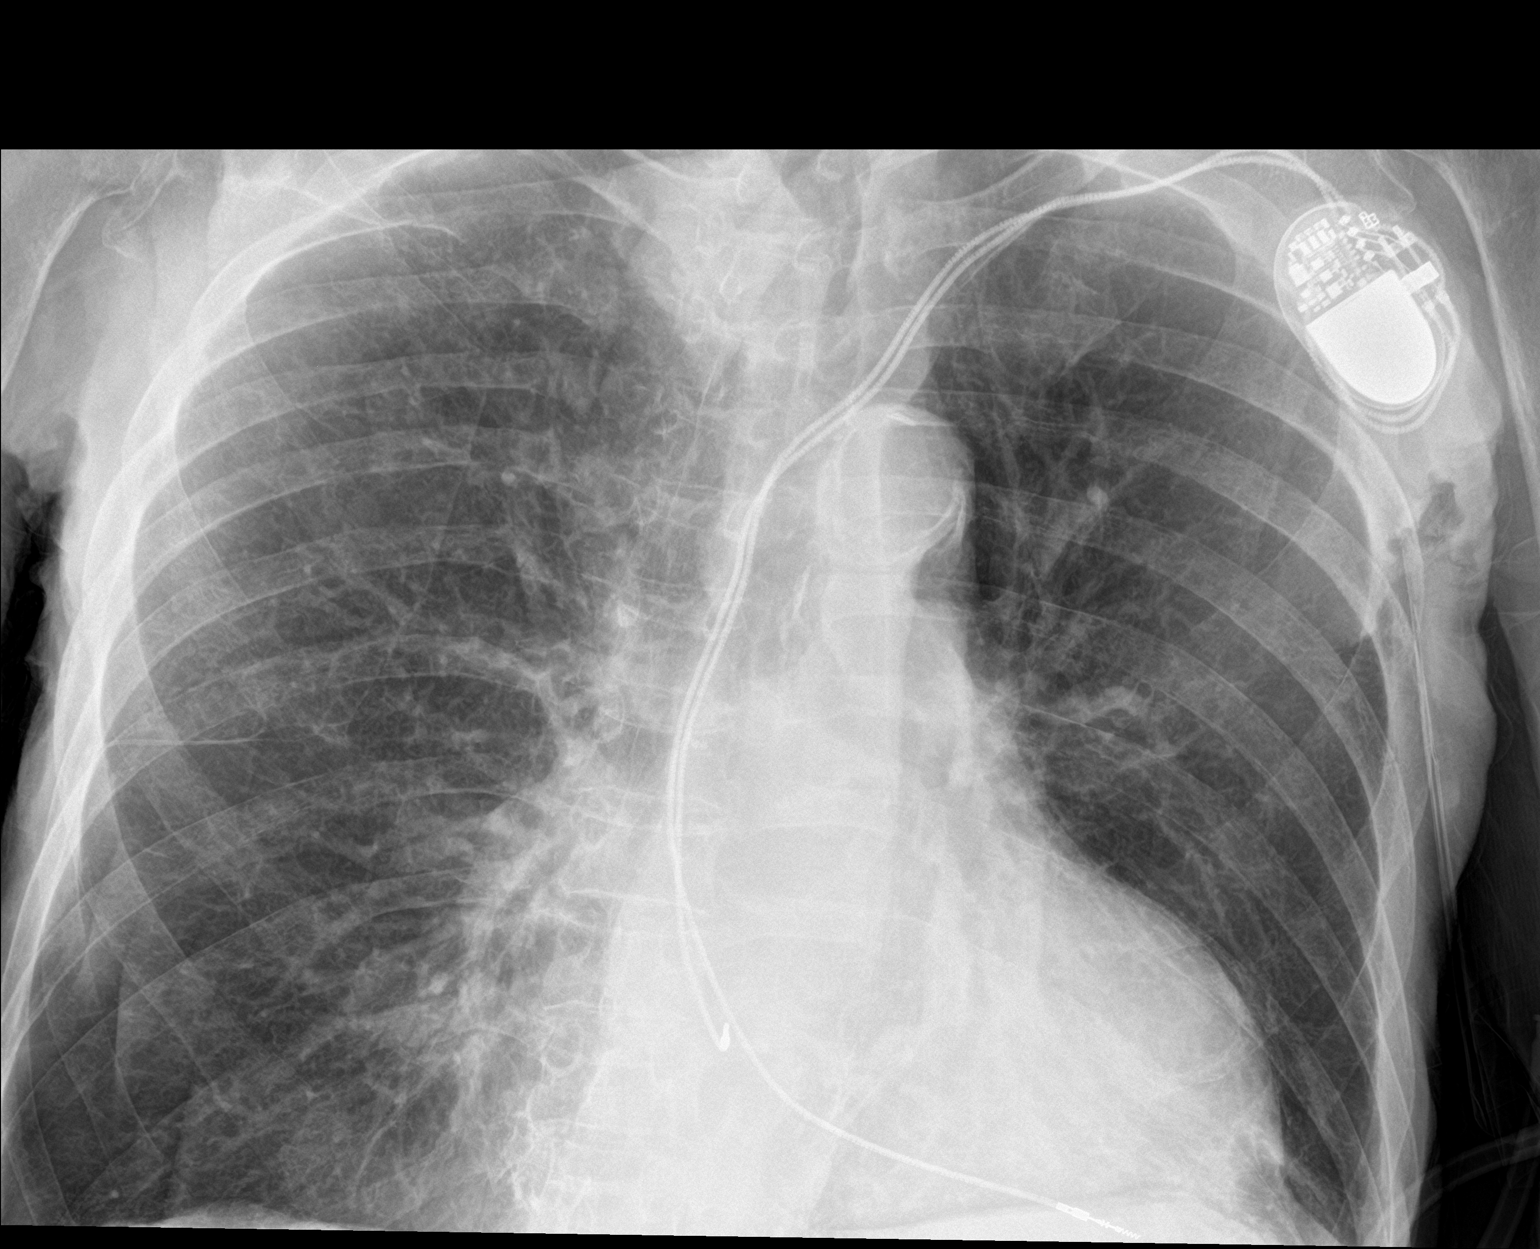

[chest ap]
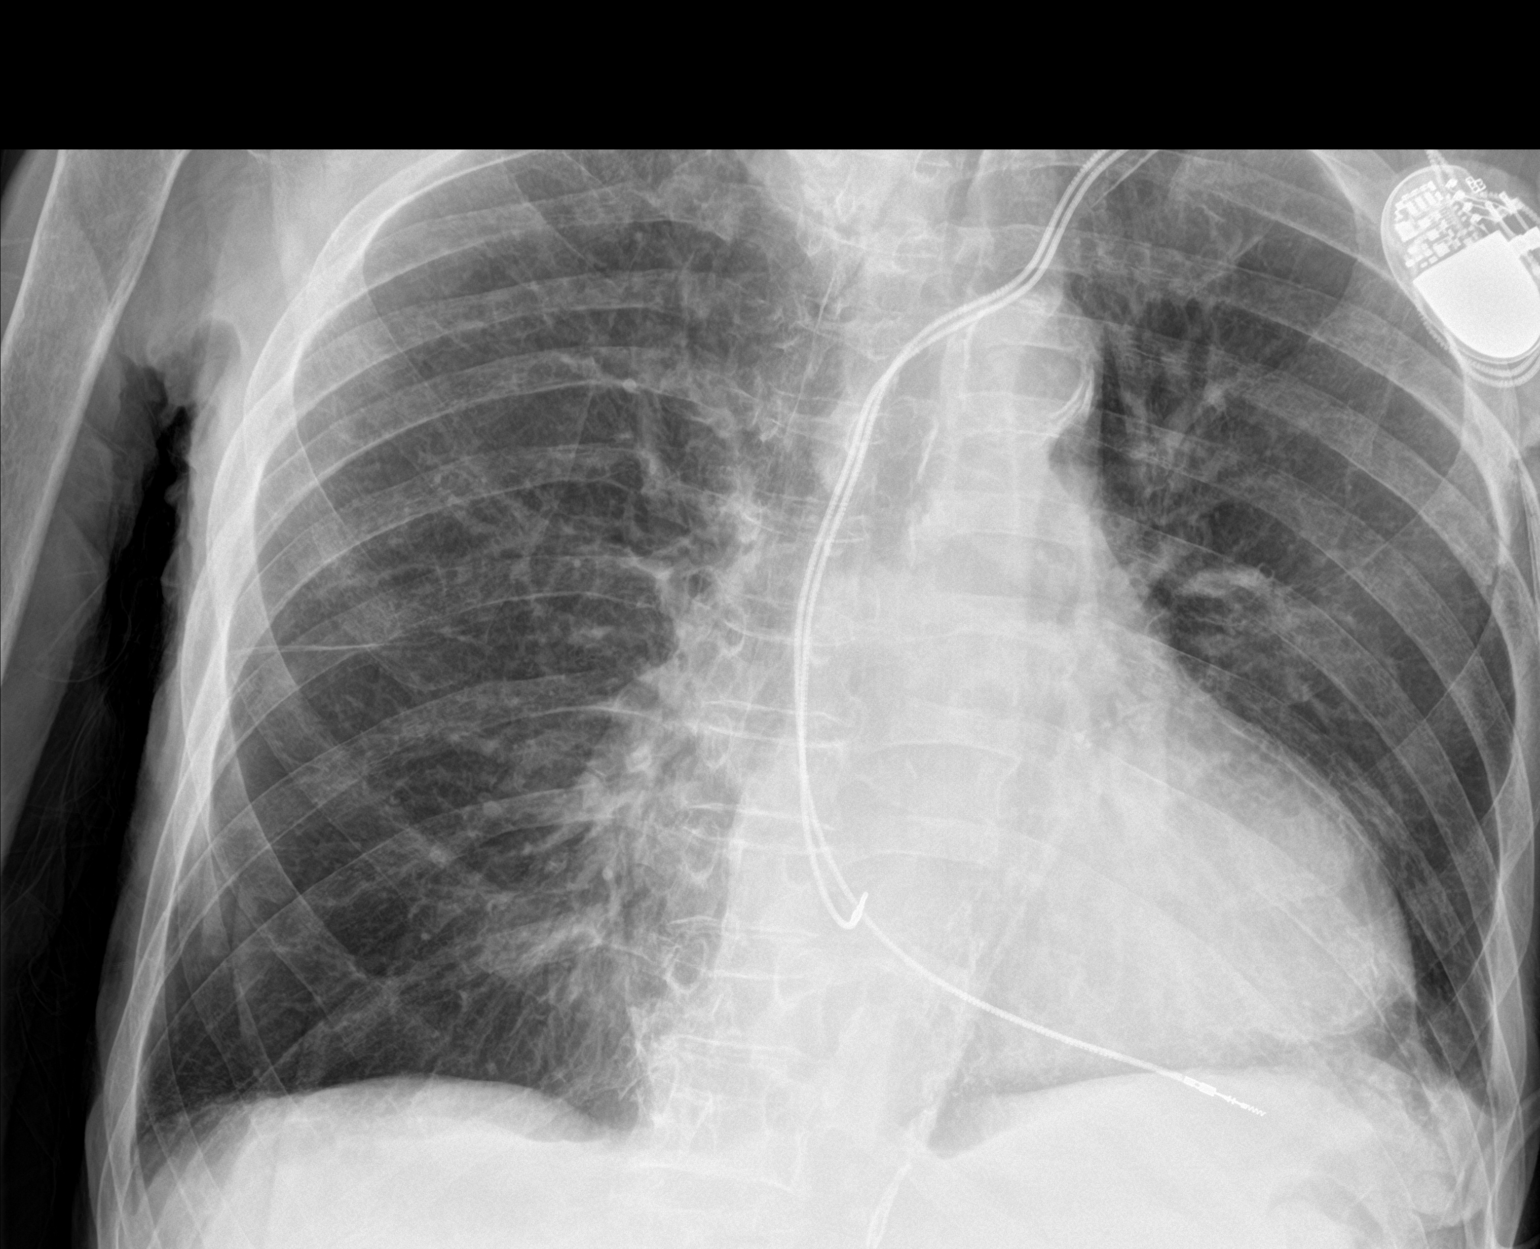

[2 of 2 positions shown; findings below may reference images not displayed]

FINDINGS: Cardiac shadow is mildly prominent but stable. Pacing device is
again seen. Aortic calcifications are again noted. Patient is
rotated to the left accentuating the mediastinal markings. The lungs
are hyperinflated without focal infiltrate. Persistent soft tissue
density to the right of the trachea is seen. This is related to
prominent vascularity and stable from the prior study. No bony
abnormality is seen.
IMPRESSION: No acute abnormality noted.

## 2020-02-23 IMAGING — CT CT L SPINE W/O CM
3 of 4 series · 10 of 33 positions shown, 11 images · non-contrast
Comparison: Lumbar spine radiographs 04/28/2019. CT abdomen and
pelvis 11/22/2015.

CLINICAL DATA: Low back pain. Multiple recent falls. Fecal
incontinence.

EXAM:
CT LUMBAR SPINE WITHOUT CONTRAST
TECHNIQUE: Multidetector CT imaging of the lumbar spine was performed without
intravenous contrast administration. Multiplanar CT image
reconstructions were also generated.

[Series 12: l spine axial st · axial · 0.37mm/px · z∈[-631,-531]mm · 2 of 151 slices shown, 3 images]
[im 51/151  soft-tissue]
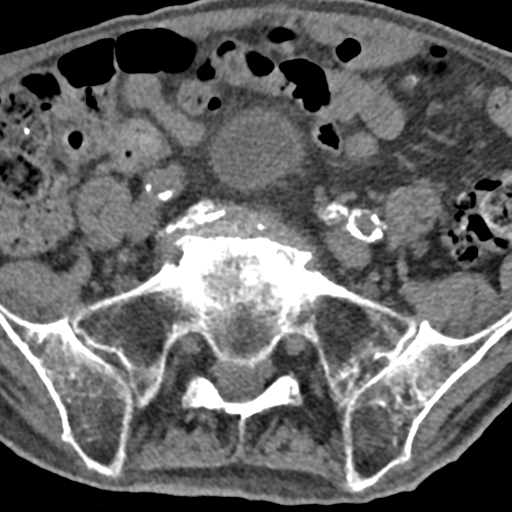
[im 51/151  bone]
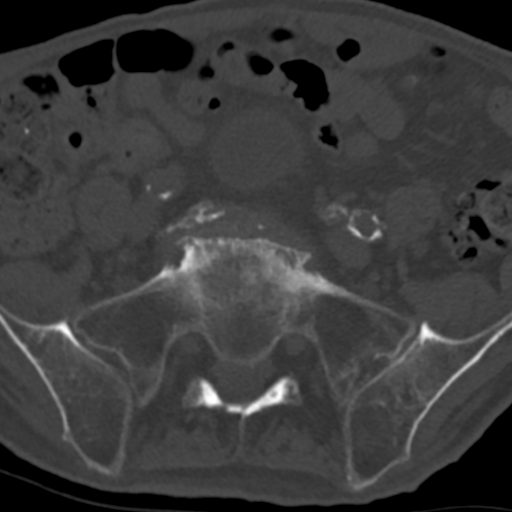
[im 101/151  bone]
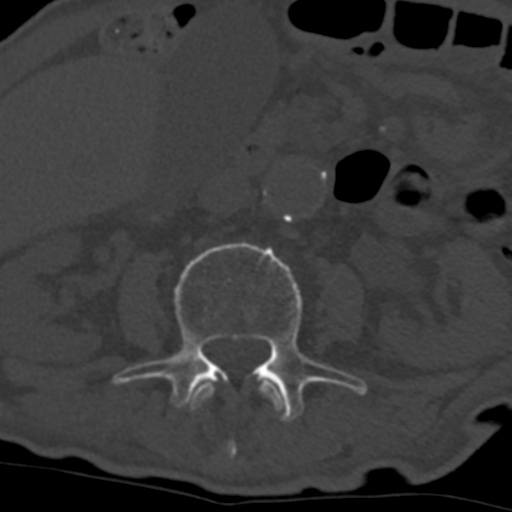

[Series 13: l spine sagittal · sagittal · 0.30mm/px · 5 of 87 slices shown]
[im 29/87  bone]
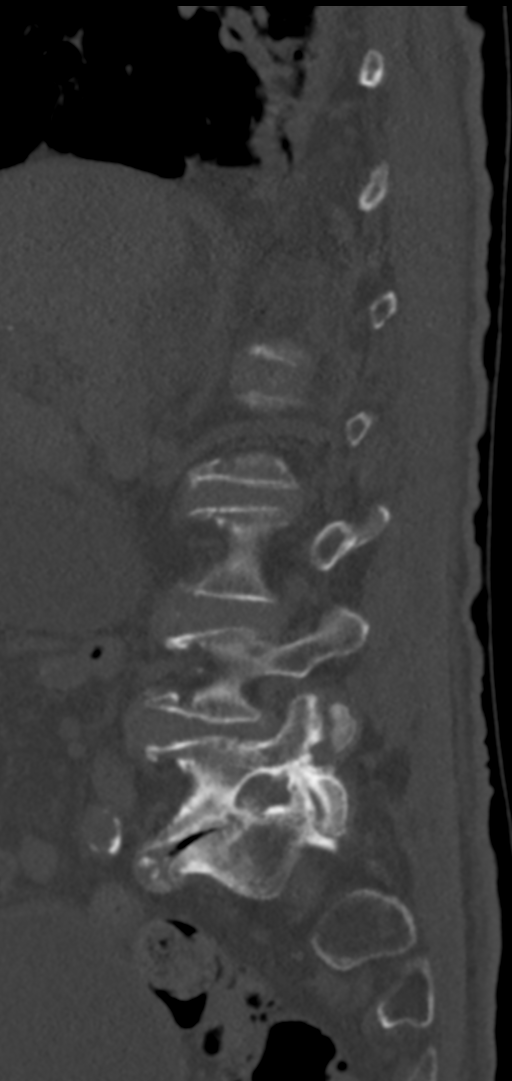
[im 36/87  bone]
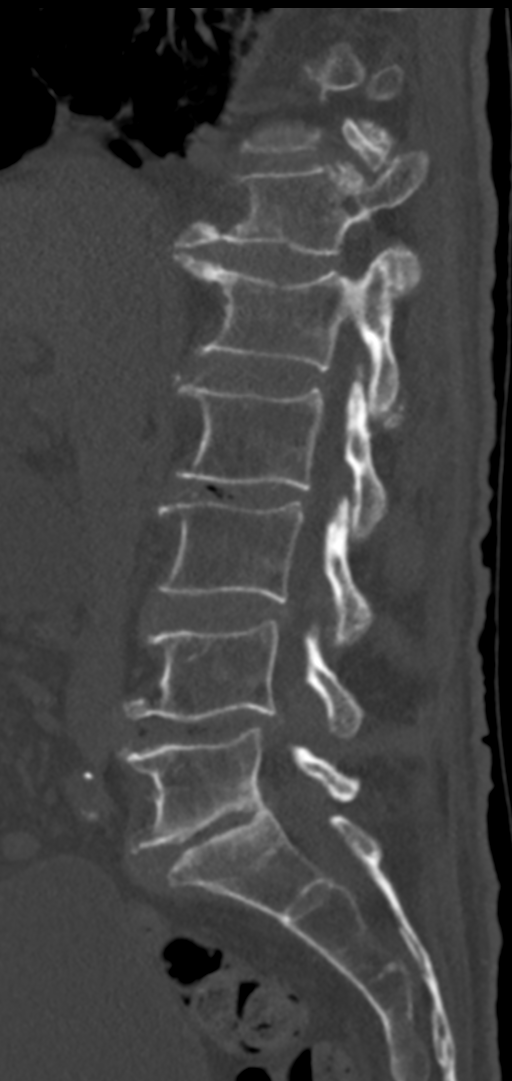
[im 44/87  bone]
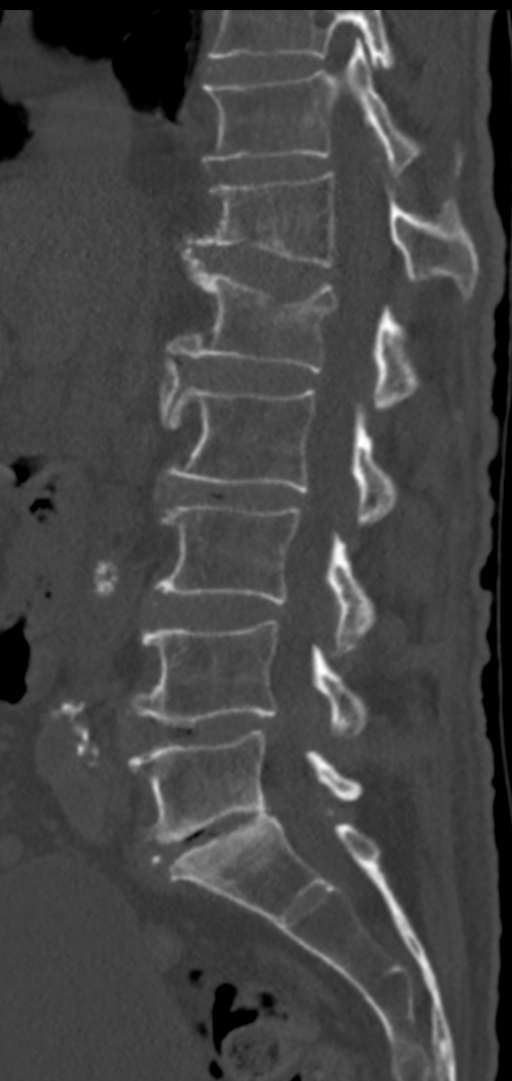
[im 51/87  bone]
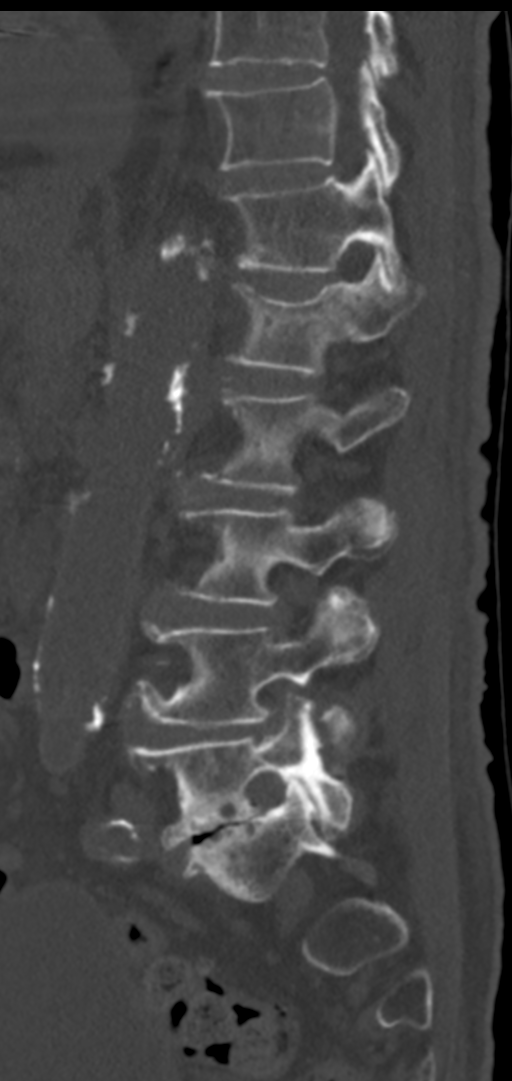
[im 58/87  bone]
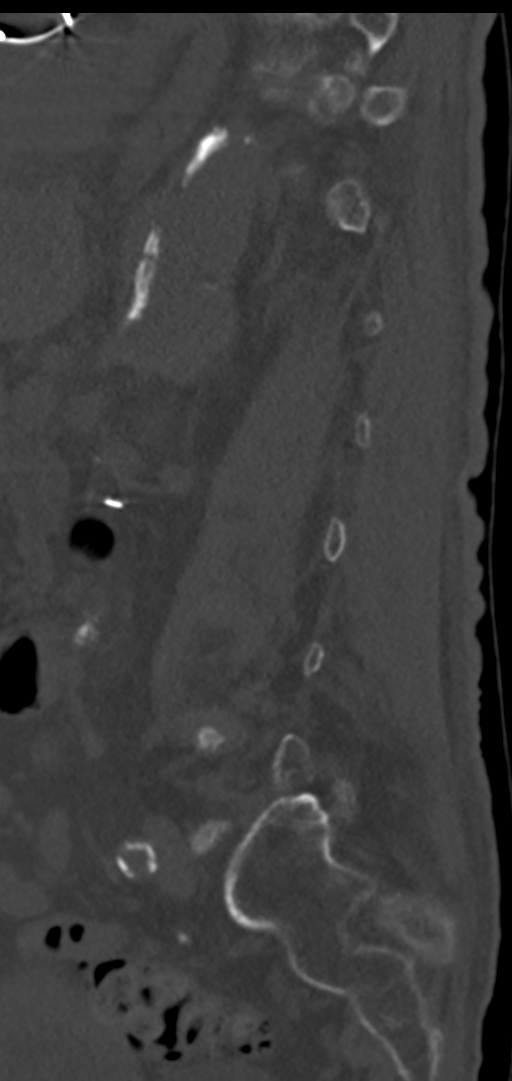

[Series 14: l spine coronal st · coronal · 0.39mm/px · 3 of 72 slices shown]
[im 15/72  bone]
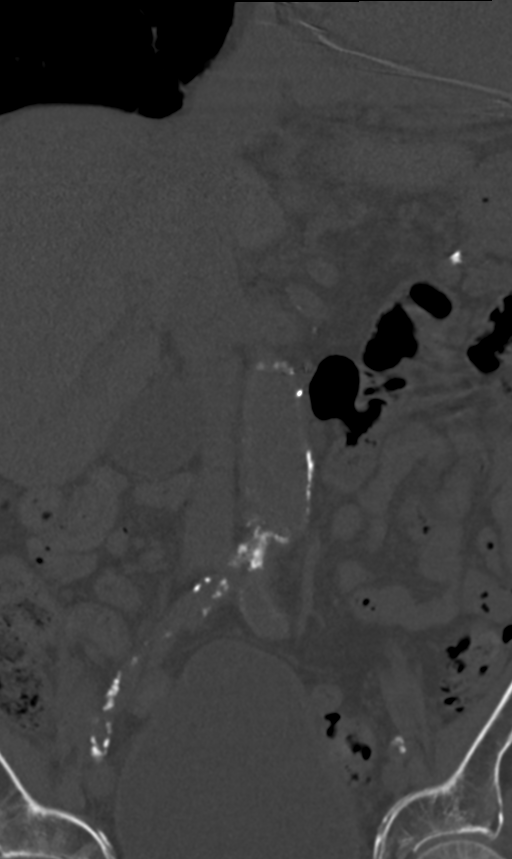
[im 29/72  bone]
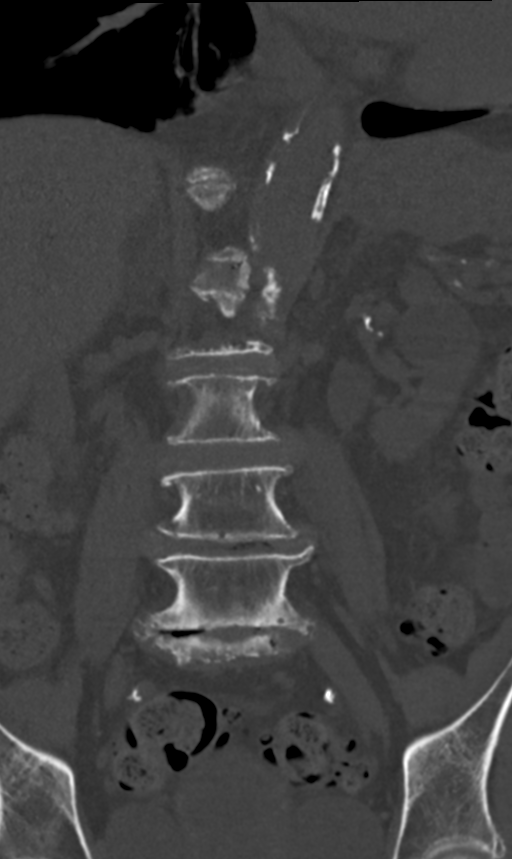
[im 43/72  bone]
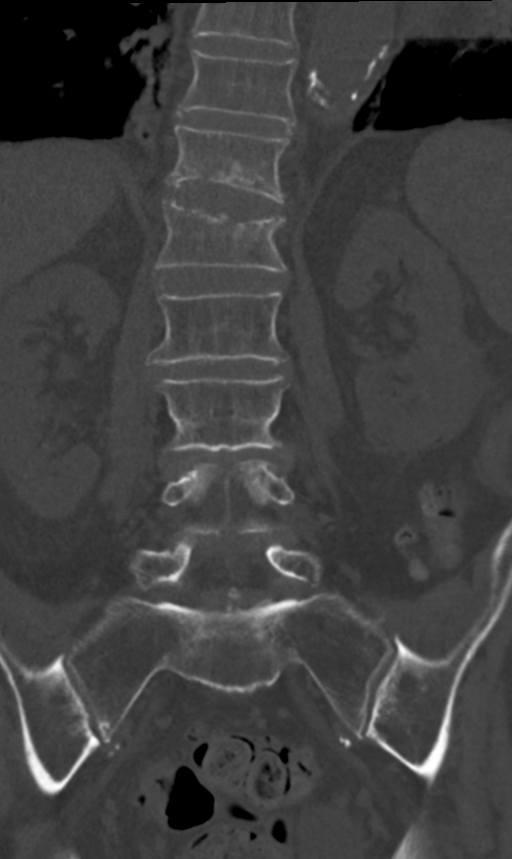

[10 of 33 positions shown; findings below may reference images not displayed]

FINDINGS: Segmentation: 5 lumbar type vertebrae.

Alignment: 5 mm retrolisthesis of L4 on L5. Trace retrolisthesis of
L3 on L4. Trace anterolisthesis of L5 on S1.

Vertebrae: Mild T12 inferior endplate and L1 superior endplate
compression fractures which are new from 2123 and may be
recent/unhealed. Nondisplaced fractures through the base of a large
anterior vertebral osteophyte at T12-L1. 3 mm retropulsion of the
posterosuperior L1 vertebral body. No posterior element fracture
identified. No suspicious osseous lesion.

Paraspinal and other soft tissues: Intra-abdominal and pelvic
contents reported separately. Possible mild paravertebral soft
tissue edema at L1.

Disc levels: Focally advanced disc degeneration at L5-S1 with
moderate to severe disc space narrowing, vacuum disc, and endplate
sclerosis. Moderate disc space narrowing at L2-3 and L4-5. Moderate
spinal stenosis and moderate bilateral neural foraminal stenosis at
L4-5 due to circumferential disc bulging and facet and ligamentum
flavum hypertrophy. Moderate to severe bilateral neural foraminal
stenosis at L5-S1 due to disc bulging, disc space height loss, and
facet arthrosis. No spinal stenosis associated with mild L1
vertebral body retropulsion.
IMPRESSION: 1. Mild T12 and L1 superior endplate compression fractures which may
be recent/unhealed. Consider MRI for further evaluation.
2. Mild L1 vertebral body retropulsion without associated spinal
stenosis.
3. Nondisplaced fractures through anterior vertebral osteophytes at
T12-L1.
4. Predominantly lower lumbar disc and facet degeneration with
moderate spinal stenosis and moderate bilateral neural foraminal
stenosis at L4-5 and moderate to severe neural foraminal stenosis at
L5-S1.
